# Patient Record
Sex: Female | Born: 1965 | Race: White | Hispanic: No | Smoking: Never smoker
Health system: Southern US, Community
[De-identification: ages and names within clinical notes are randomized; demographics above are authoritative.]

## PROBLEM LIST (undated history)

## (undated) DIAGNOSIS — E039 Hypothyroidism, unspecified: Secondary | ICD-10-CM

## (undated) DIAGNOSIS — I10 Essential (primary) hypertension: Secondary | ICD-10-CM

## (undated) DIAGNOSIS — E119 Type 2 diabetes mellitus without complications: Secondary | ICD-10-CM

## (undated) HISTORY — PX: PELVIC LAPAROSCOPY: SHX162

## (undated) HISTORY — PX: TONSILLECTOMY AND ADENOIDECTOMY: SHX28

## (undated) HISTORY — DX: Type 2 diabetes mellitus without complications: E11.9

## (undated) HISTORY — PX: WISDOM TOOTH EXTRACTION: SHX21

## (undated) HISTORY — DX: Hypothyroidism, unspecified: E03.9

## (undated) HISTORY — DX: Essential (primary) hypertension: I10

---

## 2000-01-01 ENCOUNTER — Other Ambulatory Visit: Admission: RE | Admit: 2000-01-01 | Discharge: 2000-01-01 | Payer: Self-pay | Admitting: Gynecology

## 2001-01-07 ENCOUNTER — Other Ambulatory Visit: Admission: RE | Admit: 2001-01-07 | Discharge: 2001-01-07 | Payer: Self-pay | Admitting: Gynecology

## 2002-01-19 ENCOUNTER — Other Ambulatory Visit: Admission: RE | Admit: 2002-01-19 | Discharge: 2002-01-19 | Payer: Self-pay | Admitting: Gynecology

## 2002-09-01 ENCOUNTER — Ambulatory Visit (HOSPITAL_COMMUNITY): Admission: RE | Admit: 2002-09-01 | Discharge: 2002-09-01 | Payer: Self-pay | Admitting: Cardiology

## 2002-09-01 ENCOUNTER — Encounter (INDEPENDENT_AMBULATORY_CARE_PROVIDER_SITE_OTHER): Payer: Self-pay | Admitting: Cardiology

## 2003-07-22 ENCOUNTER — Other Ambulatory Visit: Admission: RE | Admit: 2003-07-22 | Discharge: 2003-07-22 | Payer: Self-pay | Admitting: Gynecology

## 2004-08-01 ENCOUNTER — Other Ambulatory Visit: Admission: RE | Admit: 2004-08-01 | Discharge: 2004-08-01 | Payer: Self-pay | Admitting: Gynecology

## 2005-10-16 ENCOUNTER — Other Ambulatory Visit: Admission: RE | Admit: 2005-10-16 | Discharge: 2005-10-16 | Payer: Self-pay | Admitting: Gynecology

## 2007-01-07 ENCOUNTER — Other Ambulatory Visit: Admission: RE | Admit: 2007-01-07 | Discharge: 2007-01-07 | Payer: Self-pay | Admitting: Gynecology

## 2008-01-26 ENCOUNTER — Other Ambulatory Visit: Admission: RE | Admit: 2008-01-26 | Discharge: 2008-01-26 | Payer: Self-pay | Admitting: Gynecology

## 2009-02-15 ENCOUNTER — Other Ambulatory Visit: Admission: RE | Admit: 2009-02-15 | Discharge: 2009-02-15 | Payer: Self-pay | Admitting: Gynecology

## 2009-02-15 ENCOUNTER — Ambulatory Visit: Payer: Self-pay | Admitting: Gynecology

## 2009-02-15 ENCOUNTER — Encounter: Payer: Self-pay | Admitting: Gynecology

## 2010-03-19 ENCOUNTER — Ambulatory Visit: Payer: Self-pay | Admitting: Gynecology

## 2010-03-26 ENCOUNTER — Ambulatory Visit: Payer: Self-pay | Admitting: Gynecology

## 2010-07-17 ENCOUNTER — Encounter: Admission: RE | Admit: 2010-07-17 | Discharge: 2010-07-17 | Payer: Self-pay | Admitting: Gynecology

## 2011-05-10 ENCOUNTER — Other Ambulatory Visit: Payer: Self-pay | Admitting: Gynecology

## 2011-06-04 ENCOUNTER — Other Ambulatory Visit: Payer: Self-pay | Admitting: Gynecology

## 2011-06-04 ENCOUNTER — Other Ambulatory Visit: Payer: Self-pay | Admitting: *Deleted

## 2011-06-04 MED ORDER — NORETHINDRONE ACET-ETHINYL EST 1-20 MG-MCG PO TABS: 1.0000 | ORAL_TABLET | Freq: Every day | ORAL | Status: DC

## 2011-07-19 DIAGNOSIS — E039 Hypothyroidism, unspecified: Secondary | ICD-10-CM | POA: Insufficient documentation

## 2011-07-25 ENCOUNTER — Encounter: Payer: Self-pay | Admitting: Gynecology

## 2012-01-23 ENCOUNTER — Encounter: Payer: Self-pay | Admitting: Women's Health

## 2012-01-23 ENCOUNTER — Ambulatory Visit (INDEPENDENT_AMBULATORY_CARE_PROVIDER_SITE_OTHER): Payer: BC Managed Care – PPO | Admitting: Women's Health

## 2012-01-23 ENCOUNTER — Other Ambulatory Visit (HOSPITAL_COMMUNITY)
Admission: RE | Admit: 2012-01-23 | Discharge: 2012-01-23 | Disposition: A | Discharge status: Home / Self Care | Payer: BC Managed Care – PPO | Source: Ambulatory Visit | Attending: Obstetrics and Gynecology | Admitting: Obstetrics and Gynecology

## 2012-01-23 VITALS — BP 130/90 | Ht 66.0 in | Wt 127.0 lb

## 2012-01-23 DIAGNOSIS — Z309 Encounter for contraceptive management, unspecified: Secondary | ICD-10-CM

## 2012-01-23 DIAGNOSIS — IMO0001 Reserved for inherently not codable concepts without codable children: Secondary | ICD-10-CM

## 2012-01-23 DIAGNOSIS — Z833 Family history of diabetes mellitus: Secondary | ICD-10-CM

## 2012-01-23 DIAGNOSIS — E039 Hypothyroidism, unspecified: Secondary | ICD-10-CM

## 2012-01-23 DIAGNOSIS — Z01419 Encounter for gynecological examination (general) (routine) without abnormal findings: Secondary | ICD-10-CM | POA: Insufficient documentation

## 2012-01-23 DIAGNOSIS — E079 Disorder of thyroid, unspecified: Secondary | ICD-10-CM

## 2012-01-23 LAB — CBC WITH DIFFERENTIAL/PLATELET
Basophils Absolute: 0 10*3/uL (ref 0.0–0.1)
Eosinophils Absolute: 0.1 10*3/uL (ref 0.0–0.7)
Eosinophils Relative: 2 % (ref 0–5)
Lymphocytes Relative: 25 % (ref 12–46)
MCV: 93.4 fL (ref 78.0–100.0)
Neutrophils Relative %: 67 % (ref 43–77)
Platelets: 243 10*3/uL (ref 150–400)
RBC: 5.01 MIL/uL (ref 3.87–5.11)
RDW: 12.5 % (ref 11.5–15.5)
WBC: 5.9 10*3/uL (ref 4.0–10.5)

## 2012-01-23 MED ORDER — NORETHINDRONE ACET-ETHINYL EST 1-20 MG-MCG PO TABS: ORAL_TABLET | ORAL | Status: DC

## 2012-01-23 NOTE — Progress Notes (Signed)
Janice Richardson [birth year] [id number]    History:    The patient presents for annual exam.  Takes Microgestin continuously with amenorrhea. History of hypothyroidism on no thyroid replacement at this time. History of normal Paps, last Pap 2010.  Last mammogram 2011 normal. Process of a divorce, has lost approximately 25 pounds in 2 years from stress. Denies need for STD screening.   Past medical history, past surgical history, family history and social history were all reviewed and documented in the EPIC chart. Works at [employer] as a Associate Professor. Sons ages 38 and 61, 62 year old is living with her. States feel safe, does have a restraining order against husband.   ROS:  A  ROS was performed and pertinent positives and negatives are included in the history.  Exam:  Filed Vitals:   01/23/12 0916  BP: 130/90    General appearance:  Normal Head/Neck:  Normal, without cervical or supraclavicular adenopathy. Thyroid:  Symmetrical, normal in size, without palpable masses or nodularity. Respiratory  Effort:  Normal  Auscultation:  Clear without wheezing or rhonchi Cardiovascular  Auscultation:  Regular rate, without rubs, murmurs or gallops  Edema/varicosities:  Not grossly evident Abdominal  Soft,nontender, without masses, guarding or rebound.  Liver/spleen:  No organomegaly noted  Hernia:  None appreciated  Skin  Inspection:  Grossly normal  Palpation:  Grossly normal Neurologic/psychiatric  Orientation:  Normal with appropriate conversation.  Mood/affect:  Normal  Genitourinary    Breasts: Examined lying and sitting.     Right: Without masses, retractions, discharge or axillary adenopathy.     Left: Without masses, retractions, discharge or axillary adenopathy.   Inguinal/mons:  Normal without inguinal adenopathy  External genitalia:  Normal  BUS/Urethra/Skene's glands:  Normal  Bladder:  Normal  Vagina:  Normal  Cervix:  Normal  Uterus:   normal in size, shape and contour.   Midline and mobile  Adnexa/parametria:     Rt: Without masses or tenderness.   Lt: Without masses or tenderness.  Anus and perineum: Normal  Digital rectal exam: Normal sphincter tone without palpated masses or tenderness  Assessment/Plan:  46 y.o. SeperatedWF G2P2 for annual exam with no complaints.  Counseling encouraged for impending divorce Microgestin continuously/normal GYN exam History of hypothyroidism/no medications currently.  Plan: Blood pressure 130/90, the patient states blood pressures never elevated in the past, will check away from the office. Reviewed stopping birth control pills if pressure continueto be elevated. Is aware of the hazards of hypertension and health. Microgestin prescription, proper use, given and reviewed. SBE's, annual mammogram instructed to schedule. Encouraged regular exercise, leisure activities counseling as needed for impending divorce. Had a normal blood pressure, glucose, lipid profile at a health screening at work last month. CBC, thyroid panel, UA and Pap. Reviewed importance of condoms if becomes sexually active.    Harrington Challenger [facility], 11:25 AM 01/23/2012

## 2012-01-23 NOTE — Patient Instructions (Signed)

## 2012-01-24 LAB — URINALYSIS W MICROSCOPIC + REFLEX CULTURE
Hgb urine dipstick: NEGATIVE
Ketones, ur: NEGATIVE mg/dL
Nitrite: NEGATIVE
Urobilinogen, UA: 0.2 mg/dL (ref 0.0–1.0)

## 2012-01-24 LAB — T3 UPTAKE: T3 Uptake: 22.5 % (ref 22.5–37.0)

## 2012-01-24 LAB — T4: T4, Total: 14.1 ug/dL — ABNORMAL HIGH (ref 5.0–12.5)

## 2012-01-27 ENCOUNTER — Other Ambulatory Visit: Payer: Self-pay | Admitting: Women's Health

## 2012-01-27 DIAGNOSIS — N390 Urinary tract infection, site not specified: Secondary | ICD-10-CM

## 2012-01-27 LAB — URINE CULTURE: Colony Count: >=100000

## 2012-01-27 MED ORDER — NITROFURANTOIN MONOHYD MACRO 100 MG PO CAPS: 100.0000 mg | ORAL_CAPSULE | Freq: Two times a day (BID) | ORAL | Status: AC

## 2013-03-12 ENCOUNTER — Other Ambulatory Visit: Payer: Self-pay | Admitting: Women's Health

## 2014-08-01 ENCOUNTER — Encounter: Payer: Self-pay | Admitting: Women's Health

## 2015-03-14 ENCOUNTER — Other Ambulatory Visit: Payer: Self-pay

## 2015-03-14 DIAGNOSIS — Z1231 Encounter for screening mammogram for malignant neoplasm of breast: Secondary | ICD-10-CM

## 2015-03-20 ENCOUNTER — Ambulatory Visit
Admission: RE | Admit: 2015-03-20 | Discharge: 2015-03-20 | Disposition: A | Discharge status: Home / Self Care | Payer: 59 | Source: Ambulatory Visit

## 2015-03-20 DIAGNOSIS — Z1231 Encounter for screening mammogram for malignant neoplasm of breast: Secondary | ICD-10-CM

## 2016-08-08 ENCOUNTER — Encounter: Payer: Self-pay | Admitting: Women's Health

## 2016-08-08 ENCOUNTER — Telehealth: Payer: Self-pay | Admitting: *Deleted

## 2016-08-08 ENCOUNTER — Ambulatory Visit (INDEPENDENT_AMBULATORY_CARE_PROVIDER_SITE_OTHER): Payer: BLUE CROSS/BLUE SHIELD | Admitting: Women's Health

## 2016-08-08 VITALS — BP 170/82 | Ht 66.0 in | Wt 127.0 lb

## 2016-08-08 DIAGNOSIS — Z113 Encounter for screening for infections with a predominantly sexual mode of transmission: Secondary | ICD-10-CM | POA: Diagnosis not present

## 2016-08-08 DIAGNOSIS — Z30011 Encounter for initial prescription of contraceptive pills: Secondary | ICD-10-CM | POA: Diagnosis not present

## 2016-08-08 MED ORDER — NORETHIN ACE-ETH ESTRAD-FE 1-20 MG-MCG PO TABS: 1.0000 | ORAL_TABLET | Freq: Every day | ORAL | 1 refills | Status: DC

## 2016-08-08 MED ORDER — NORETHIN ACE-ETH ESTRAD-FE 1-20 MG-MCG PO TABS: 1.0000 | ORAL_TABLET | Freq: Every day | ORAL | 11 refills | Status: DC

## 2016-08-08 NOTE — Progress Notes (Signed)
Presents for evaluation of irregular bleeding X1 day. Very heavy bleeding like a menstrual cycle yesterday with reduction in blood flow today. She denies cramping, discharge, urinary symptoms, abdominal pain. States that she had some spotting a few months ago and her PCP increased her Microgestin from 1/20 to 1.5/30. Takes the pill continuously and has not had a period in over a year. Continuous OCs for many years for migraine prevention. Same partner for 1.5 years, no STD screening. Unsure if partner is faithful. Thyroid function measured by PCP, normal. States under increased stress, difficult relationship with her ex-husband and current partner.   Exam: Appears Well. Speculum exam: Scant amount of blood. No visible polyp on cervix, cervix looks healthy. Bimanual no CMT or adnexal tenderness uterus small. GC/Chlamydia culture taken  Break through bleeding on OCs  STD screening  Plan: Reassurance given minimal bleeding today. [place] out month of Microgestin 1.5/30, stop pills for 4 days, and then restart Microgestin 1/20. GC/Chlamydia culture pending, HIV, hep B, C, RPR. Counseling recommended. Keep physical appointment in December with Dr. Audie BoxFontaine. Instructed to call with worsening bleeding.

## 2016-08-08 NOTE — Telephone Encounter (Signed)
Pt called c/o heavy vaginal bleeding, last seen in 2013 states this office has not been on her formulary with insurance that is the reason she has not been back. Office now is on formulary, pt said yesterday she had extreme heavy bleeding with clots, takes birth control pills on 3rd pack, bleeding not as heavy today, but still bleeding, PCP told her to follow up with our office. Pt very concerned about bleeding, transferred to front desk to schedule. AEX on 09/05/16 with TF

## 2016-08-09 LAB — HEPATITIS C ANTIBODY: HCV AB: NEGATIVE

## 2016-08-09 LAB — HEPATITIS B SURFACE ANTIGEN: Hepatitis B Surface Ag: NEGATIVE

## 2016-08-09 LAB — RPR

## 2016-08-09 LAB — HIV ANTIBODY (ROUTINE TESTING W REFLEX): HIV: NONREACTIVE

## 2016-08-09 LAB — GC/CHLAMYDIA PROBE AMP
CT PROBE, AMP APTIMA: NOT DETECTED
GC PROBE AMP APTIMA: NOT DETECTED

## 2016-09-05 ENCOUNTER — Ambulatory Visit: Payer: Self-pay | Admitting: Gynecology

## 2016-11-06 ENCOUNTER — Ambulatory Visit: Payer: Self-pay | Admitting: Gynecology

## 2016-11-06 DIAGNOSIS — Z0289 Encounter for other administrative examinations: Secondary | ICD-10-CM

## 2016-12-17 ENCOUNTER — Ambulatory Visit (INDEPENDENT_AMBULATORY_CARE_PROVIDER_SITE_OTHER): Payer: BLUE CROSS/BLUE SHIELD | Admitting: Women's Health

## 2016-12-17 ENCOUNTER — Encounter: Payer: Self-pay | Admitting: Women's Health

## 2016-12-17 ENCOUNTER — Telehealth: Payer: Self-pay

## 2016-12-17 VITALS — BP 140/90 | Ht 66.0 in | Wt 154.6 lb

## 2016-12-17 DIAGNOSIS — R5383 Other fatigue: Secondary | ICD-10-CM | POA: Diagnosis not present

## 2016-12-17 DIAGNOSIS — Z1151 Encounter for screening for human papillomavirus (HPV): Secondary | ICD-10-CM

## 2016-12-17 DIAGNOSIS — Z3041 Encounter for surveillance of contraceptive pills: Secondary | ICD-10-CM

## 2016-12-17 DIAGNOSIS — Z01419 Encounter for gynecological examination (general) (routine) without abnormal findings: Secondary | ICD-10-CM

## 2016-12-17 LAB — TSH: TSH: 3.91 m[IU]/L

## 2016-12-17 MED ORDER — NORETHIN-ETH ESTRAD-FE BIPHAS 1 MG-10 MCG / 10 MCG PO TABS: 1.0000 | ORAL_TABLET | Freq: Every day | ORAL | 4 refills | Status: DC

## 2016-12-17 NOTE — Telephone Encounter (Signed)
Amanda at the pharmacy called to confirm that you changed the patient's birth control pill Rx today. She previously was on Loestrin FE 1/20 and today they received Rx for her for LoLoestrin 1-10.   Please advise.

## 2016-12-17 NOTE — Progress Notes (Signed)
Hyman Hopesina B Kamen November 23, 1965 [id number]    History:    Presents for annual exam with complaining of loosing hair, gain weight and cold extremities.   Monthly cycle,  on Loestrin, for  prevented migraine.  History of hypothyroidism on no medication at this time. History of normal Pap smear. Negative STD screening/ same partner. Gain 25 pounds in the last 2 years.  Reports normal blood work at primary care less than 6 months ago.  Past medical history, past surgical history, family history and social history were all reviewed and documented in the EPIC chart. 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 Has 2 growing sons. Father diabetes, hypercholesterolemia.  ROS:  A ROS was performed and pertinent positives and negatives are included.  Exam:  Vitals:   12/17/16 1516 12/17/16 1549  BP: 140/90 140/90  Weight: 154 lb 9.6 oz (70.1 kg)   Height: 5\' 6"  (1.676 m)    Body mass index is 24.95 kg/m.   General appearance:  Normal Thyroid:  Symmetrical, normal in size, without palpable masses or nodularity. Respiratory  Auscultation:  Clear without wheezing or rhonchi Cardiovascular  Auscultation:  Regular rate, without rubs, murmurs or gallops  Edema/varicosities:  Not grossly evident Abdominal  Soft,nontender, without masses, guarding or rebound.  Liver/spleen:  No organomegaly noted  Hernia:  None appreciated  Skin  Inspection:  Grossly normal   Breasts: Examined lying and sitting.     Right: Without masses, retractions, discharge or axillary adenopathy.     Left: Without masses, retractions, discharge or axillary adenopathy. Gentitourinary   Inguinal/mons:  Normal without inguinal adenopathy  External genitalia:  Normal  BUS/Urethra/Skene's glands:  Normal  Vagina:  Normal  Cervix:  Normal  Uterus:  , normal in size, shape and contour.  Midline and mobile  Adnexa/parametria:     Rt: Without masses or tenderness.   Lt: Without masses or  tenderness.  Anus and perineum: Normal  Digital rectal exam: Normal sphincter tone without palpated masses or tenderness  Assessment/Plan:  51 y.o. DWF @P2  for annual exam with complaint of loosing hair , gain wight, cold extremities.  Elevated blood pressure today History of hypothyroidism Monthly cycle on OCs  Plan: Reviewed blood pressure 140/90 twice will try lo Loestrin after finishing current pack. Instructed to check blood pressure away from office if persists over 130/80 instructed to call.  discussed risk of stroke and blood clots with OCs and elevated blood pressure. SBE's, encouraged annual screening mammogram, overdue instructed to schedule. Encouraged regular exercise, vitamin D 2000 daily and biotin. Reviewed screening colonoscopy at age 50, [facility] information given. Encouraged to decrease tanning. TSH, Pap with HR HPV typing, new screening guidelines reviewed.    Harrington ChallengerYOUNG,Neoma Uhrich J V[facility] Trumbull CampusWHNP, 4:13 PM 12/17/2016

## 2016-12-17 NOTE — Patient Instructions (Signed)
Health Maintenance, Female Adopting a healthy lifestyle and getting preventive care can go a long way to promote health and wellness. Talk with your health care provider about what schedule of regular examinations is right for you. This is a good chance for you to check in with your provider about disease prevention and staying healthy. In between checkups, there are plenty of things you can do on your own. Experts have done a lot of research about which lifestyle changes and preventive measures are most likely to keep you healthy. Ask your health care provider for more information. Weight and diet Eat a healthy diet  Be sure to include plenty of vegetables, fruits, low-fat dairy products, and lean protein.  Do not eat a lot of foods high in solid fats, added sugars, or salt.  Get regular exercise. This is one of the most important things you can do for your health.  Most adults should exercise for at least 150 minutes each week. The exercise should increase your heart rate and make you sweat (moderate-intensity exercise).  Most adults should also do strengthening exercises at least twice a week. This is in addition to the moderate-intensity exercise. Maintain a healthy weight  Body mass index (BMI) is a measurement that can be used to identify possible weight problems. It estimates body fat based on height and weight. Your health care provider can help determine your BMI and help you achieve or maintain a healthy weight.  For females 76 years of age and older:  A BMI below 18.5 is considered underweight.  A BMI of 18.5 to 24.9 is normal.  A BMI of 25 to 29.9 is considered overweight.  A BMI of 30 and above is considered obese. Watch levels of cholesterol and blood lipids  You should start having your blood tested for lipids and cholesterol at 50 years of age, then have this test every 5 years.  You may need to have your cholesterol levels checked more often if:  Your lipid or  cholesterol levels are high.  You are older than 50 years of age.  You are at high risk for heart disease. Cancer screening Lung Cancer  Lung cancer screening is recommended for adults 64-42 years old who are at high risk for lung cancer because of a history of smoking.  A yearly low-dose CT scan of the lungs is recommended for people who:  Currently smoke.  Have quit within the past 15 years.  Have at least a 30-pack-year history of smoking. A pack year is smoking an average of one pack of cigarettes a day for 1 year.  Yearly screening should continue until it has been 15 years since you quit.  Yearly screening should stop if you develop a health problem that would prevent you from having lung cancer treatment. Breast Cancer  Practice breast self-awareness. This means understanding how your breasts normally appear and feel.  It also means doing regular breast self-exams. Let your health care provider know about any changes, no matter how small.  If you are in your 20s or 30s, you should have a clinical breast exam (CBE) by a health care provider every 1-3 years as part of a regular health exam.  If you are 34 or older, have a CBE every year. Also consider having a breast X-ray (mammogram) every year.  If you have a family history of breast cancer, talk to your health care provider about genetic screening.  If you are at high risk for breast cancer, talk  to your health care provider about having an MRI and a mammogram every year.  Breast cancer gene (BRCA) assessment is recommended for women who have family members with BRCA-related cancers. BRCA-related cancers include:  Breast.  Ovarian.  Tubal.  Peritoneal cancers.  Results of the assessment will determine the need for genetic counseling and BRCA1 and BRCA2 testing. Cervical Cancer  Your health care provider may recommend that you be screened regularly for cancer of the pelvic organs (ovaries, uterus, and vagina).  This screening involves a pelvic examination, including checking for microscopic changes to the surface of your cervix (Pap test). You may be encouraged to have this screening done every 3 years, beginning at age 24.  For women ages 66-65, health care providers may recommend pelvic exams and Pap testing every 3 years, or they may recommend the Pap and pelvic exam, combined with testing for human papilloma virus (HPV), every 5 years. Some types of HPV increase your risk of cervical cancer. Testing for HPV may also be done on women of any age with unclear Pap test results.  Other health care providers may not recommend any screening for nonpregnant women who are considered low risk for pelvic cancer and who do not have symptoms. Ask your health care provider if a screening pelvic exam is right for you.  If you have had past treatment for cervical cancer or a condition that could lead to cancer, you need Pap tests and screening for cancer for at least 20 years after your treatment. If Pap tests have been discontinued, your risk factors (such as having a new sexual partner) need to be reassessed to determine if screening should resume. Some women have medical problems that increase the chance of getting cervical cancer. In these cases, your health care provider may recommend more frequent screening and Pap tests. Colorectal Cancer  This type of cancer can be detected and often prevented.  Routine colorectal cancer screening usually begins at 50 years of age and continues through 50 years of age.  Your health care provider may recommend screening at an earlier age if you have risk factors for colon cancer.  Your health care provider may also recommend using home test kits to check for hidden blood in the stool.  A small camera at the end of a tube can be used to examine your colon directly (sigmoidoscopy or colonoscopy). This is done to check for the earliest forms of colorectal cancer.  Routine  screening usually begins at age 41.  Direct examination of the colon should be repeated every 5-10 years through 50 years of age. However, you may need to be screened more often if early forms of precancerous polyps or small growths are found. Skin Cancer  Check your skin from head to toe regularly.  Tell your health care provider about any new moles or changes in moles, especially if there is a change in a mole's shape or color.  Also tell your health care provider if you have a mole that is larger than the size of a pencil eraser.  Always use sunscreen. Apply sunscreen liberally and repeatedly throughout the day.  Protect yourself by wearing long sleeves, pants, a wide-brimmed hat, and sunglasses whenever you are outside. Heart disease, diabetes, and high blood pressure  High blood pressure causes heart disease and increases the risk of stroke. High blood pressure is more likely to develop in:  People who have blood pressure in the high end of the normal range (130-139/85-89 mm Hg).  People who are overweight or obese.  People who are African American.  If you are 59-24 years of age, have your blood pressure checked every 3-5 years. If you are 34 years of age or older, have your blood pressure checked every year. You should have your blood pressure measured twice-once when you are at a hospital or clinic, and once when you are not at a hospital or clinic. Record the average of the two measurements. To check your blood pressure when you are not at a hospital or clinic, you can use:  An automated blood pressure machine at a pharmacy.  A home blood pressure monitor.  If you are between 29 years and 60 years old, ask your health care provider if you should take aspirin to prevent strokes.  Have regular diabetes screenings. This involves taking a blood sample to check your fasting blood sugar level.  If you are at a normal weight and have a low risk for diabetes, have this test once  every three years after 50 years of age.  If you are overweight and have a high risk for diabetes, consider being tested at a younger age or more often. Preventing infection Hepatitis B  If you have a higher risk for hepatitis B, you should be screened for this virus. You are considered at high risk for hepatitis B if:  You were born in a country where hepatitis B is common. Ask your health care provider which countries are considered high risk.  Your parents were born in a high-risk country, and you have not been immunized against hepatitis B (hepatitis B vaccine).  You have HIV or AIDS.  You use needles to inject street drugs.  You live with someone who has hepatitis B.  You have had sex with someone who has hepatitis B.  You get hemodialysis treatment.  You take certain medicines for conditions, including cancer, organ transplantation, and autoimmune conditions. Hepatitis C  Blood testing is recommended for:  Everyone born from [birth year] through 1964.  Anyone with known risk factors for hepatitis C. Sexually transmitted infections (STIs)  You should be screened for sexually transmitted infections (STIs) including gonorrhea and chlamydia if:  You are sexually active and are younger than 50 years of age.  You are older than 50 years of age and your health care provider tells you that you are at risk for this type of infection.  Your sexual activity has changed since you were last screened and you are at an increased risk for chlamydia or gonorrhea. Ask your health care provider if you are at risk.  If you do not have HIV, but are at risk, it may be recommended that you take a prescription medicine daily to prevent HIV infection. This is called pre-exposure prophylaxis (PrEP). You are considered at risk if:  You are sexually active and do not regularly use condoms or know the HIV status of your partner(s).  You take drugs by injection.  You are sexually active with a partner  who has HIV. Talk with your health care provider about whether you are at high risk of being infected with HIV. If you choose to begin PrEP, you should first be tested for HIV. You should then be tested every 3 months for as long as you are taking PrEP. Pregnancy  If you are premenopausal and you may become pregnant, ask your health care provider about preconception counseling.  If you may become pregnant, take 400 to 800 micrograms (mcg) of folic acid  every day.  If you want to prevent pregnancy, talk to your health care provider about birth control (contraception). Osteoporosis and menopause  Osteoporosis is a disease in which the bones lose minerals and strength with aging. This can result in serious bone fractures. Your risk for osteoporosis can be identified using a bone density scan.  If you are 4 years of age or older, or if you are at risk for osteoporosis and fractures, ask your health care provider if you should be screened.  Ask your health care provider whether you should take a calcium or vitamin D supplement to lower your risk for osteoporosis.  Menopause may have certain physical symptoms and risks.  Hormone replacement therapy may reduce some of these symptoms and risks. Talk to your health care provider about whether hormone replacement therapy is right for you. Follow these instructions at home:  Schedule regular health, dental, and eye exams.  Stay current with your immunizations.  Do not use any tobacco products including cigarettes, chewing tobacco, or electronic cigarettes.  If you are pregnant, do not drink alcohol.  If you are breastfeeding, limit how much and how often you drink alcohol.  Limit alcohol intake to no more than 1 drink per day for nonpregnant women. One drink equals 12 ounces of beer, 5 ounces of wine, or 1 ounces of hard liquor.  Do not use street drugs.  Do not share needles.  Ask your health care provider for help if you need support  or information about quitting drugs.  Tell your health care provider if you often feel depressed.  Tell your health care provider if you have ever been abused or do not feel safe at home. This information is not intended to replace advice given to you by your health care provider. Make sure you discuss any questions you have with your health care provider. Document Released: 04/01/2011 Document Revised: 02/22/2016 Document Reviewed: 06/20/2015 Elsevier Interactive Patient Education  2017 [employer].

## 2016-12-18 ENCOUNTER — Telehealth: Payer: Self-pay

## 2016-12-18 ENCOUNTER — Encounter: Payer: Self-pay | Admitting: Women's Health

## 2016-12-18 MED ORDER — NORETHINDRONE 0.35 MG PO TABS: ORAL_TABLET | ORAL | 4 refills | Status: DC

## 2016-12-18 NOTE — Telephone Encounter (Signed)
She is having blood pressure increase, but try Micronor 1 daily, no placebo week take daily may have some irregular bleeding initially. 3 packs 4 refills.

## 2016-12-18 NOTE — Telephone Encounter (Signed)
Patient received new Rx yesterday for Lo-Loestrin 1-10.  Patient said she cannot afford it not even with a coupon they offer. She paid nothing for her previous Rx.  The pharmacy asked if there is another Rx you might want to try her on that might be more affordable for her?

## 2016-12-18 NOTE — Telephone Encounter (Signed)
New Rx sent.

## 2016-12-18 NOTE — Telephone Encounter (Signed)
Per office note "Reviewed blood pressure 140/90 twice will try lo Loestrin after finishing current pack" I advised pharmacy that change in Rx was correct.

## 2016-12-19 LAB — HPV TYPE 16 AND 18/45 RNA
HPV TYPE 16 RNA: NOT DETECTED
HPV TYPE 18/45 RNA: DETECTED — AB

## 2016-12-20 LAB — PAP, TP IMAGING W/ HPV RNA, RFLX HPV TYPE 16,18/45: HPV MRNA, HIGH RISK: DETECTED — AB

## 2017-01-30 ENCOUNTER — Telehealth: Payer: Self-pay

## 2017-01-30 NOTE — Telephone Encounter (Signed)
I called patient and reminded her that we spoke on 12/23/16 about her needing colpo and she was going to call back to schedule and she has not. She said things have been busy. I offered to have the appt desk call her back but she declined. I explained the importance of her calling back to schedule and she assures me she will.

## 2017-02-13 ENCOUNTER — Other Ambulatory Visit: Payer: Self-pay | Admitting: Women's Health

## 2017-02-13 DIAGNOSIS — Z30011 Encounter for initial prescription of contraceptive pills: Secondary | ICD-10-CM

## 2017-10-15 DIAGNOSIS — E1165 Type 2 diabetes mellitus with hyperglycemia: Secondary | ICD-10-CM | POA: Insufficient documentation

## 2017-11-03 ENCOUNTER — Other Ambulatory Visit: Payer: Self-pay | Admitting: Gynecology

## 2017-11-03 DIAGNOSIS — Z1231 Encounter for screening mammogram for malignant neoplasm of breast: Secondary | ICD-10-CM

## 2017-11-06 ENCOUNTER — Other Ambulatory Visit: Payer: Self-pay | Admitting: Women's Health

## 2017-11-06 ENCOUNTER — Encounter: Payer: Self-pay | Admitting: Women's Health

## 2017-11-06 DIAGNOSIS — Z30011 Encounter for initial prescription of contraceptive pills: Secondary | ICD-10-CM

## 2017-11-07 ENCOUNTER — Other Ambulatory Visit: Payer: Self-pay | Admitting: Women's Health

## 2017-11-07 DIAGNOSIS — Z30011 Encounter for initial prescription of contraceptive pills: Secondary | ICD-10-CM

## 2017-11-24 ENCOUNTER — Ambulatory Visit
Admission: RE | Admit: 2017-11-24 | Discharge: 2017-11-24 | Disposition: A | Discharge status: Home / Self Care | Payer: Self-pay | Source: Ambulatory Visit | Attending: Gynecology | Admitting: Gynecology

## 2017-11-24 DIAGNOSIS — Z1231 Encounter for screening mammogram for malignant neoplasm of breast: Secondary | ICD-10-CM

## 2017-11-25 ENCOUNTER — Other Ambulatory Visit: Payer: Self-pay | Admitting: Gynecology

## 2017-11-25 DIAGNOSIS — R928 Other abnormal and inconclusive findings on diagnostic imaging of breast: Secondary | ICD-10-CM

## 2017-11-28 ENCOUNTER — Ambulatory Visit
Admission: RE | Admit: 2017-11-28 | Discharge: 2017-11-28 | Disposition: A | Discharge status: Home / Self Care | Payer: BLUE CROSS/BLUE SHIELD | Source: Ambulatory Visit | Attending: Gynecology | Admitting: Gynecology

## 2017-11-28 ENCOUNTER — Other Ambulatory Visit: Payer: Self-pay | Admitting: Gynecology

## 2017-11-28 ENCOUNTER — Ambulatory Visit: Payer: BLUE CROSS/BLUE SHIELD

## 2017-11-28 DIAGNOSIS — N6489 Other specified disorders of breast: Secondary | ICD-10-CM

## 2017-11-28 DIAGNOSIS — R928 Other abnormal and inconclusive findings on diagnostic imaging of breast: Secondary | ICD-10-CM

## 2018-06-03 ENCOUNTER — Ambulatory Visit
Admission: RE | Admit: 2018-06-03 | Discharge: 2018-06-03 | Disposition: A | Discharge status: Home / Self Care | Payer: BLUE CROSS/BLUE SHIELD | Source: Ambulatory Visit | Attending: Gynecology | Admitting: Gynecology

## 2018-06-03 DIAGNOSIS — N6489 Other specified disorders of breast: Secondary | ICD-10-CM

## 2018-11-11 ENCOUNTER — Other Ambulatory Visit: Payer: Self-pay | Admitting: Family Medicine

## 2018-11-11 DIAGNOSIS — Z1231 Encounter for screening mammogram for malignant neoplasm of breast: Secondary | ICD-10-CM

## 2018-12-07 ENCOUNTER — Ambulatory Visit: Payer: BLUE CROSS/BLUE SHIELD

## 2019-01-01 ENCOUNTER — Ambulatory Visit: Payer: BLUE CROSS/BLUE SHIELD

## 2019-03-12 ENCOUNTER — Encounter: Payer: Self-pay | Admitting: Family Medicine

## 2019-03-23 ENCOUNTER — Other Ambulatory Visit: Payer: Self-pay

## 2019-03-24 ENCOUNTER — Other Ambulatory Visit: Payer: Self-pay

## 2019-03-24 ENCOUNTER — Ambulatory Visit (INDEPENDENT_AMBULATORY_CARE_PROVIDER_SITE_OTHER): Payer: BC Managed Care – PPO | Admitting: Endocrinology

## 2019-03-24 ENCOUNTER — Encounter: Payer: Self-pay | Admitting: Endocrinology

## 2019-03-24 VITALS — BP 142/82 | HR 105 | Temp 98.1°F | Ht 65.0 in | Wt 155.0 lb

## 2019-03-24 DIAGNOSIS — R5383 Other fatigue: Secondary | ICD-10-CM | POA: Diagnosis not present

## 2019-03-24 DIAGNOSIS — E1165 Type 2 diabetes mellitus with hyperglycemia: Secondary | ICD-10-CM | POA: Diagnosis not present

## 2019-03-24 MED ORDER — GLUCOSE BLOOD VI STRP: ORAL_STRIP | 12 refills | Status: DC

## 2019-03-24 MED ORDER — DULOXETINE HCL 30 MG PO CPEP: 30.0000 mg | ORAL_CAPSULE | Freq: Every day | ORAL | 3 refills | Status: AC

## 2019-03-24 MED ORDER — CANAGLIFLOZIN 100 MG PO TABS: ORAL_TABLET | ORAL | 3 refills | Status: DC

## 2019-03-24 MED ORDER — ONETOUCH DELICA LANCETS 33G MISC: 12 refills | Status: DC

## 2019-03-24 NOTE — Progress Notes (Addendum)
Reason for Appointment: Consultation for Type 2 Diabetes  Referring PCP: Janace Litten   History of Present Illness:          Date of diagnosis of type 2 diabetes mellitus: 2017        Background history:   She says her initial diagnosis was on a routine test and she was started on metformin for high blood sugars She has been followed by PCP and has been on a variety of medications previously She had not been started on home glucose monitoring until very recently She had been on metformin for quite some time  Also has had somewhat inconsistent follow-up, A1c results in the past are detailed below Prior A1c results: 11/18/2018 = 9.3 07/30/2018 = 9.6 10/15/2017 = 9.4   Recent history:   Most recent A1c is 8.9 done on 01/26/2019   Non-insulin hypoglycemic drugs the patient is taking are: Metformin 1 g twice daily  Current management, blood sugar patterns and problems identified:  Patient was given a trial of Trulicity probably for about 3 months and this was stopped in 12/2018 because of lack of efficacy and A1c of 8.9  She was then started on Basaglar and Januvia but with this she started gaining weight and felt lethargic and sleepy and she does not think her sugars were better even with taking 25 units which she did for about a month until 3 weeks ago but also she experienced discomfort at the site of injection  Patient does not have any specific meal plan but is usually trying to avoid high fat foods and high carbohydrate meals  Because of fatigue and her feet hurting she does not do any walking for exercise  She is still has not lost any weight recently and overall she appears to have gained 4 pounds since PCP visit last month  She is checking blood sugars from a over-the-counter GE meter which cannot be downloaded  Lowest blood sugar recently has been 189 and highest over 300  She says that her blood sugars are high even when she is eating less        Side  effects from medications have been:      Meal times are:  Breakfast is at Lunch: Dinner:    Typical meal intake: Breakfast is                Exercise: none  Glucose monitoring:  done 1-2  times a day         Glucometer:  GE     Blood Glucose readings by time of day from patient record  PREMEAL Breakfast Lunch Dinner Bedtime  Overall   Glucose range: 189, 242      Median:        POST-MEAL PC Breakfast PC Lunch PC Dinner  Glucose range:   326  Median:       Dietician visit, most recent: None  Weight history:  Wt Readings from Last 3 Encounters:  03/24/19 155 lb (70.3 kg)  12/17/16 154 lb 9.6 oz (70.1 kg)  08/08/16 127 lb (57.6 kg)    Glycemic control:    No results found for: HGBA1C No results found for: GLUF, MICROALBUR, LDLCALC, CREATININE No results found for: MICRALBCREAT  No results found for: FRUCTOSAMINE  No visits with results within 1 Week(s) from this visit.  Latest known visit with results is:  Office Visit on 12/17/2016  Component Date Value Ref Range Status  . TSH 12/17/2016 3.91  mIU/L Final  Comment:   Reference Range   > or = 20 Years  0.40-4.50   Pregnancy Range First trimester  0.26-2.66 Second trimester 0.55-2.73 Third trimester  0.43-2.91     . HPV mRNA, High Risk 12/17/2016 Detected*  Final   Comment: One or more High/Intermediate HPV types (16,18,31,33,35,39,45,51,52,56,58,59,66,68) was detected.                  ** Normal Reference Range: Not Detected **      HPV High Risk testing performed using the APTIMA HPV mRNA Assay.     Marland Kitchen. Specimen adequacy: 12/17/2016 SEE NOTE   Final   SATISFACTORY.  Endocervical/transformation zone component present.  Marland Kitchen. FINAL DIAGNOSIS: 12/17/2016 SEE NOTE*  Final   Comment: - EPITHELIAL CELL ABNORMALITY:  ATYPICAL SQUAMOUS CELLS OF UNDETERMINED SIGNIFICANCE (ASC-US).   Marland Kitchen. COMMENTS: 12/17/2016 SEE NOTE   Final   Comment: Shift in flora suggestive of bacterial vaginosis. LMP (Last Menstrual Period)  of the patient is not given. This Pap test has been evaluated with computer assisted technology.   . RECOMMENDATIONS: 12/17/2016 SEE NOTE   Final   Comment: FOLLOW-UP is suggested as clinically indicated. For a more comprehensive discussion of these recommendations, please refer to the [email or web address] website.   . Cytotechnologist: 12/17/2016 SEE NOTE   Final   KS, CT(HEW)  . Pathologist: 12/17/2016 SEE NOTE   Final   Comment: Reviewed by S. Dene GentrySerdar Demirci, MD, (Electronic Signature on File) *  The Pap is a screening test for cervical cancer. It is not a  diagnostic test and is subject to false negative and false positive  results. It is most reliable when a satisfactory sample, regularly  obtained, is submitted with relevant clinical findings and history,  and when the Pap result is evaluated along with historic and current  clinical information.   Marland Kitchen. HPV Type 16 RNA 12/17/2016 Not Detected   Final  . HPV Type 18/45 RNA 12/17/2016 Detected*  Final   Comment:                  ** Normal Reference Range: Not Detected **        HPV High Risk testing performed using the APTIMA HPV 16, 18/45      mRNA Assay.     Allergies as of 03/24/2019      Reactions   Penicillins       Medication List       Accurate as of March 24, 2019  9:24 PM. If you have any questions, ask your nurse or doctor.        STOP taking these medications   norethindrone 0.35 MG tablet Commonly known as: MICRONOR Stopped by: Reather LittlerAjay Loyd Marhefka, MD   Norethindrone-Ethinyl Estradiol-Fe Biphas 1 MG-10 MCG / 10 MCG tablet Commonly known as: LO LOESTRIN FE Stopped by: Reather LittlerAjay Nazir Hacker, MD   PRILOSEC PO Stopped by: Reather LittlerAjay Yee Joss, MD   ranitidine 150 MG tablet Commonly known as: ZANTAC Stopped by: Reather LittlerAjay Kristyl Athens, MD     TAKE these medications   ALPRAZolam 0.5 MG tablet Commonly known as: XANAX Take 1 tablet by mouth at bedtime.   Blisovi FE 1/20 1-20 MG-MCG tablet Generic drug: norethindrone-ethinyl estradiol TAKE 1 TABLET BY  MOUTH DAILY.   busPIRone 10 MG tablet Commonly known as: BUSPAR Take 1 tablet by mouth 2 (two) times a day.   canagliflozin 100 MG Tabs tablet Commonly known as: Invokana 1 tablet before breakfast Started by: Reather LittlerAjay Asyia Hornung, MD   DULoxetine 30 MG capsule  Commonly known as: CYMBALTA Take 1 capsule (30 mg total) by mouth daily. Take with dinner Started by: Reather LittlerAjay Rayan Dyal, MD   gabapentin 300 MG capsule Commonly known as: NEURONTIN Take 1 capsule by mouth 3 (three) times daily.   glucose blood test strip Use as instructed Started by: Reather LittlerAjay Delle Andrzejewski, MD   lisinopril 20 MG tablet Commonly known as: ZESTRIL Take 1 tablet by mouth 2 (two) times a day.   metFORMIN 500 MG 24 hr tablet Commonly known as: GLUCOPHAGE-XR Take 1,000 mg by mouth 2 (two) times daily.   OneTouch Delica Lancets 33G Misc Use as directed to check blood sugar BID Started by: Reather LittlerAjay Elyas Villamor, MD   pantoprazole 40 MG tablet Commonly known as: PROTONIX Take 1 tablet by mouth daily.       Allergies:  Allergies  Allergen Reactions  . Penicillins     Past Medical History:  Diagnosis Date  . Diabetes (HCC)   . Hypertension   . Hypothyroid    HISTORY OF LOW THYROID, resolved 2005    Past Surgical History:  Procedure Laterality Date  . PELVIC LAPAROSCOPY    . TONSILLECTOMY AND ADENOIDECTOMY    . WISDOM TOOTH EXTRACTION      Family History  Problem Relation Age of Onset  . Uterine cancer Mother   . Diabetes Neg Hx     Social History:  reports that she has never smoked. She has never used smokeless tobacco. She reports that she does not drink alcohol or use drugs.   Review of Systems  Constitutional: Positive for weight gain.  HENT: Negative for headaches.   Eyes: Negative for blurred vision.  Respiratory: Negative for shortness of breath.   Cardiovascular: Negative for leg swelling.  Gastrointestinal: Negative for diarrhea and abdominal pain.  Endocrine: Positive for fatigue.       She had postpartum  thyroiditis 22 years ago and took thyroid supplement still about 15 years ago  Genitourinary: Negative for frequency.       No history of recent UTI.  Will get yeast infection only when taking antibiotics  Musculoskeletal: Positive for joint pain and muscle aches.       Her feet hurt.  She also has shoulder pain She also complains of significant aching in various muscles in different parts at different times  Neurological: Positive for numbness and tingling.       She has coldness in her feet in the evenings and in the mornings may have burning.  She has persistent pain in her feet and this is worse at night or when she is resting.  She does not think she has had any relief from taking gabapentin 1 in the morning and 2 at bedtime  Psychiatric/Behavioral: Positive for nervousness.       She recently has had some grief from the loss of her mother. She takes Xanax and buspirone, has not been on antidepressants    Lipid history: Lipids done in 09/2017 showed LDL 86, triglycerides 276   No results found for: CHOL, HDL, LDLCALC, LDLDIRECT, TRIG, CHOLHDL         Hypertension: Has been present.  Recently because of higher blood pressure readings he is taking lisinopril 20 mg twice daily  BP Readings from Last 3 Encounters:  03/24/19 (!) 142/82  12/17/16 140/90  08/08/16 (!) 170/82    Most recent eye exam was in 04/2018, Dr. Sherilyn CooterWilliam Walker  Most recent foot exam: 03/2019  Currently known complications of diabetes: Peripheral neuropathy  LABS:  No visits with results within 1 Week(s) from this visit.  Latest known visit with results is:  Office Visit on 12/17/2016  Component Date Value Ref Range Status  . TSH 12/17/2016 3.91  mIU/L Final   Comment:   Reference Range   > or = 20 Years  0.40-4.50   Pregnancy Range First trimester  0.26-2.66 Second trimester 0.55-2.73 Third trimester  0.43-2.91     . HPV mRNA, High Risk 12/17/2016 Detected*  Final   Comment: One or more  High/Intermediate HPV types (16,18,31,33,35,39,45,51,52,56,58,59,66,68) was detected.                  ** Normal Reference Range: Not Detected **      HPV High Risk testing performed using the APTIMA HPV mRNA Assay.     Marland Kitchen Specimen adequacy: 12/17/2016 SEE NOTE   Final   SATISFACTORY.  Endocervical/transformation zone component present.  Marland Kitchen FINAL DIAGNOSIS: 12/17/2016 SEE NOTE*  Final   Comment: - EPITHELIAL CELL ABNORMALITY:  ATYPICAL SQUAMOUS CELLS OF UNDETERMINED SIGNIFICANCE (ASC-US).   Marland Kitchen COMMENTS: 12/17/2016 SEE NOTE   Final   Comment: Shift in flora suggestive of bacterial vaginosis. LMP (Last Menstrual Period) of the patient is not given. This Pap test has been evaluated with computer assisted technology.   . RECOMMENDATIONS: 12/17/2016 SEE NOTE   Final   Comment: FOLLOW-UP is suggested as clinically indicated. For a more comprehensive discussion of these recommendations, please refer to the [email or web address] website.   . Cytotechnologist: 12/17/2016 SEE NOTE   Final   KS, CT(HEW)  . Pathologist: 12/17/2016 SEE NOTE   Final   Comment: Reviewed by S. Dene Gentry, MD, (Electronic Signature on File) *  The Pap is a screening test for cervical cancer. It is not a  diagnostic test and is subject to false negative and false positive  results. It is most reliable when a satisfactory sample, regularly  obtained, is submitted with relevant clinical findings and history,  and when the Pap result is evaluated along with historic and current  clinical information.   Marland Kitchen HPV Type 16 RNA 12/17/2016 Not Detected   Final  . HPV Type 18/45 RNA 12/17/2016 Detected*  Final   Comment:                  ** Normal Reference Range: Not Detected **        HPV High Risk testing performed using the APTIMA HPV 16, 18/45      mRNA Assay.     Physical Examination:  BP (!) 142/82 (BP Location: Right Arm, Patient Position: Sitting, Cuff Size: Normal)   Pulse (!) 105   Temp 98.1 F (36.7 C) (Oral)    Ht  (1.651 m)   Wt 155 lb (70.3 kg)   SpO2 98%   BMI 25.79 kg/m   GENERAL:     HEENT:         Eye exam shows normal external appearance.  Fundus exam deferred to ophthalmologist     NECK:   There is no lymphadenopathy  Thyroid is not enlarged and no nodules felt.   Systemic exam not done to avoid proximity to patient    NEUROLOGICAL:   Ankle jerks are absent bilaterally.    Diabetic Foot Exam - Simple   Simple Foot Form Diabetic Foot exam was performed with the following findings: Yes   Visual Inspection No deformities, no ulcerations, no other skin breakdown bilaterally: Yes Sensation Testing Intact to touch and monofilament testing bilaterally:  Yes See comments: Yes Pulse Check Posterior Tibialis and Dorsalis pulse intact bilaterally: Yes Comments Inconsistent decrease in monofilament sensation in left first and second toes            Vibration sense is absent in distal first toes.  MUSCULOSKELETAL:  There is no swelling or deformity of the peripheral joints.     EXTREMITIES:     There is no ankle edema.  SKIN:       No rash or lesions of concern.        ASSESSMENT:  Diabetes type 2, uncontrolled  See history of present illness for detailed discussion of current diabetes management, blood sugar patterns and problems identified  Recent A1c of 8.9 with taking metformin and Trulicity  Current treatment regimen is metformin only She has apparently had not improved her control with Trulicity, basal insulin, Januvia was concerned about weight gain with insulin Appears to have consistently high readings and not clear if she is doing enough readings after meals to assess her control Also not clear if her generic monitor is accurate  Complications of diabetes: Peripheral neuropathy  Hypertension by PCP  History of hypertriglyceridemia, not recently assessed  History of abnormal liver functions as of 10/2018, not recently assessed, may be related to hepatic  steatosis   PLAN:    1. Glucose monitoring: . Patient advised to check readings either fasting or 2 hours after meals.  She will check with her insurance to see which meter will be covered but in the meantime will start her on One Touch Verio monitor with a sample today.  She will need to bring this for download on each visit  2.  Diabetes education: . Patient may need consultation with dietitian for meal planning, this will be further assessed  3.  Lifestyle changes: . Dietary changes have a protein consistently with every meal . Exercise regimen: Restart walking when able to  4.  Medication changes needed: Discussed action of SGLT 2 drugs on lowering glucose by decreasing kidney absorption of glucose, benefits of weight loss and lower blood pressure, possible side effects including candidiasis and dosage regimen  . Start Invokana 100 mg daily before breakfast in addition to metformin; she will be having renal function checked today to confirm it is normal . Increase fluid intake, watch blood pressure changes . She will reduce her lisinopril to 20 mg daily to avoid lower blood pressure readings or renal dysfunction with adding Invokana  NEUROPATHY treatment: She will be given a trial of Cymbalta 30 mg daily at dinnertime She can continue taking 300 mg gabapentin at bedtime  DEPRESSION and anxiety: She is on BuSpar and will add Cymbalta as above She will also benefit from Cymbalta if she is having muscle aches from FIBROMYALGIA  5.  Preventive care needed:  . Regular foot exams, microalbumin testing  6.  Follow-up: 1 month  7.  Check thyroid levels for prior history of hypothyroidism and current symptoms of fatigue and myalgia Check chemistry including liver functions   There are no Patient Instructions on file for this visit.   Consultation note has been sent to the referring physician  Counseling time on subjects discussed in assessment and plan sections is over 50% of  today's 60 minute visit   Reather LittlerAjay Parnika Tweten 03/24/2019, 9:24 PM   Note: This office note was prepared with Dragon voice recognition system technology. Any transcriptional errors that result from this process are unintentional.

## 2019-03-25 LAB — TSH: TSH: 3.25 u[IU]/mL (ref 0.35–4.50)

## 2019-03-25 LAB — COMPREHENSIVE METABOLIC PANEL
ALT: 66 U/L — ABNORMAL HIGH (ref 0–35)
AST: 82 U/L — ABNORMAL HIGH (ref 0–37)
Albumin: 3.9 g/dL (ref 3.5–5.2)
Alkaline Phosphatase: 70 U/L (ref 39–117)
BUN: 11 mg/dL (ref 6–23)
CO2: 22 mEq/L (ref 19–32)
Calcium: 9.1 mg/dL (ref 8.4–10.5)
Chloride: 95 mEq/L — ABNORMAL LOW (ref 96–112)
Creatinine, Ser: 0.69 mg/dL (ref 0.40–1.20)
GFR: 89.48 mL/min (ref >60.00)
Glucose, Bld: 280 mg/dL — ABNORMAL HIGH (ref 70–99)
Potassium: 4.4 mEq/L (ref 3.5–5.1)
Sodium: 129 mEq/L — ABNORMAL LOW (ref 135–145)
Total Bilirubin: 0.4 mg/dL (ref 0.2–1.2)
Total Protein: 7.2 g/dL (ref 6.0–8.3)

## 2019-03-25 LAB — T4, FREE: Free T4: 1.04 ng/dL (ref 0.60–1.60)

## 2019-04-14 ENCOUNTER — Telehealth: Payer: Self-pay | Admitting: Endocrinology

## 2019-04-14 NOTE — Telephone Encounter (Signed)
She needs to check with her insurance to see what meter they will cover.  We could not download her meter.  Okay to take Diflucan

## 2019-04-14 NOTE — Telephone Encounter (Signed)
Please advise. Pt was informed that her labs were forwarded to her PCP. She was not told that her PCP would be calling her.

## 2019-04-14 NOTE — Telephone Encounter (Signed)
Patients states that the OneTouch meter that was given to her by Dr.Kumar, supplies are to expensive and she would like to continue using her other GE meter from [employer].  Patient also brought up when she spoke with Olen Cordial on 6/26 about her labs and while he stated her PCP would call her and she never heard from them.  Patient also called the after hours service on 04/01/19, "Caller states she has s/s of yeast infection. Has discharge and itching, has tried OTC meds without success. Was started on Invocana last week." They had faxed over orders informing that they had patient take Diflucan 150 mg 1 tablet. Patient wanted to make sure with Dr.Kumar this was ok.  Please Advise, Thanks

## 2019-04-14 NOTE — Telephone Encounter (Signed)
Called pt and gave her MD message. Pt verbalized understanding and stated that she has quit taking Invokana because of the side effects it has been causing.

## 2019-04-19 ENCOUNTER — Other Ambulatory Visit: Payer: Self-pay

## 2019-04-19 ENCOUNTER — Other Ambulatory Visit (INDEPENDENT_AMBULATORY_CARE_PROVIDER_SITE_OTHER): Payer: BC Managed Care – PPO

## 2019-04-19 DIAGNOSIS — E1165 Type 2 diabetes mellitus with hyperglycemia: Secondary | ICD-10-CM

## 2019-04-20 LAB — BASIC METABOLIC PANEL
BUN: 10 mg/dL (ref 6–23)
CO2: 24 mEq/L (ref 19–32)
Calcium: 9.3 mg/dL (ref 8.4–10.5)
Chloride: 97 mEq/L (ref 96–112)
Creatinine, Ser: 0.67 mg/dL (ref 0.40–1.20)
GFR: 92.55 mL/min (ref >60.00)
Glucose, Bld: 156 mg/dL — ABNORMAL HIGH (ref 70–99)
Potassium: 4.1 mEq/L (ref 3.5–5.1)
Sodium: 133 mEq/L — ABNORMAL LOW (ref 135–145)

## 2019-04-20 LAB — FRUCTOSAMINE: Fructosamine: 342 umol/L — ABNORMAL HIGH (ref 0–285)

## 2019-04-21 ENCOUNTER — Ambulatory Visit: Payer: BC Managed Care – PPO | Admitting: Endocrinology

## 2019-04-21 ENCOUNTER — Other Ambulatory Visit: Payer: Self-pay

## 2019-04-21 ENCOUNTER — Encounter: Payer: Self-pay | Admitting: Endocrinology

## 2019-04-21 VITALS — BP 118/80 | HR 102 | Ht 65.0 in | Wt 152.0 lb

## 2019-04-21 DIAGNOSIS — E1165 Type 2 diabetes mellitus with hyperglycemia: Secondary | ICD-10-CM | POA: Diagnosis not present

## 2019-04-21 LAB — POCT GLYCOSYLATED HEMOGLOBIN (HGB A1C): Hemoglobin A1C: 9 % — AB (ref 4.0–5.6)

## 2019-04-21 MED ORDER — ACCU-CHEK GUIDE W/DEVICE KIT: 1.0000 | PACK | Freq: Three times a day (TID) | 0 refills | Status: AC

## 2019-04-21 MED ORDER — OZEMPIC (0.25 OR 0.5 MG/DOSE) 2 MG/1.5ML ~~LOC~~ SOPN: 0.5000 mg | PEN_INJECTOR | SUBCUTANEOUS | 2 refills | Status: DC

## 2019-04-21 MED ORDER — OZEMPIC (0.25 OR 0.5 MG/DOSE) 2 MG/1.5ML ~~LOC~~ SOPN: 0.5000 mg | PEN_INJECTOR | SUBCUTANEOUS | 2 refills | Status: AC

## 2019-04-21 MED ORDER — ACCU-CHEK GUIDE VI STRP: ORAL_STRIP | 3 refills | Status: DC

## 2019-04-21 MED ORDER — ACCU-CHEK FASTCLIX LANCETS MISC: 3 refills | Status: AC

## 2019-04-21 NOTE — Patient Instructions (Signed)
Check blood sugars on waking up 3 days a week  Also check blood sugars about 2 hours after meals and do this after different meals by rotation  Recommended blood sugar levels on waking up are 90-130 and about 2 hours after meal is 130-160  Please bring your blood sugar monitor to each visit, thank you   

## 2019-04-21 NOTE — Progress Notes (Signed)
Reason for Appointment: Follow-up for Type 2 Diabetes  Referring PCP: Keturah Barreobert Robbins   History of Present Illness:          Date of diagnosis of type 2 diabetes mellitus: 2017        Background history:   She says her initial diagnosis was on a routine test and she was started on metformin for high blood sugars She has been followed by PCP and has been on a variety of medications previously She had not been started on home glucose monitoring until very recently She had been on metformin for quite some time  Also has had somewhat inconsistent follow-up, A1c results in the past are detailed below Prior A1c results: 11/18/2018 = 9.3 07/30/2018 = 9.6 10/15/2017 = 9.4  Patient was given a trial of Trulicity probably for about 3 months and this was stopped in 12/2018 because of lack of efficacy and A1c of 8.9   Recent history:   Her A1c is about the same at 9%   Non-insulin hypoglycemic drugs the patient is taking are: Metformin 1 g twice daily  Current management, blood sugar patterns and problems identified:  Patient was given a trial of INVOKANA on her initial consultation about a month ago  She says that after about 10 days she had fairly severe yeast infection and she stopped Invokana  Even though she took it regularly she did not think her blood sugars were any different however she checks blood sugars mostly fasting only  She states that her blood sugars in the mornings are higher if she has a healthier low-fat meal and does not see any correlation  She has generally been trying to eat healthy meals and controlling high carbohydrate intake  She still has not started any regular exercise despite reminders  Her lab glucose was 156 but she did not have a home reading at the same time  She continues to use a generic meter because of cost and unable to figure out what her insurance will cover        Side effects from medications have been: Vaginal candidiasis  from Invokana  Typical meal intake: Usually low-fat       Exercise: none  Glucose monitoring:  done 1-2  times a day         Glucometer:  GE     Blood Glucose readings by time of day from patient meter review   PRE-MEAL Fasting Lunch Dinner Bedtime Overall  Glucose range:  137-345      Mean/median:         POST-MEAL PC Breakfast PC Lunch PC Dinner  Glucose range:    230-362  Mean/median:      Previous readings:  PREMEAL Breakfast Lunch Dinner Bedtime  Overall   Glucose range: 189, 242      Median:        POST-MEAL PC Breakfast PC Lunch PC Dinner  Glucose range:   326  Median:       Dietician visit, most recent: None  Weight history:  Wt Readings from Last 3 Encounters:  04/21/19 152 lb (68.9 kg)  03/24/19 155 lb (70.3 kg)  12/17/16 154 lb 9.6 oz (70.1 kg)    Glycemic control:     Lab Results  Component Value Date   HGBA1C 9.0 (A) 04/21/2019   Lab Results  Component Value Date   CREATININE 0.67 04/19/2019   No results found for: [facility]  Lab Results  Component Value Date   FRUCTOSAMINE  342 (H) 04/19/2019    Office Visit on 04/21/2019  Component Date Value Ref Range Status  . Hemoglobin A1C 04/21/2019 9.0* 4.0 - 5.6 % Final  Lab on 04/19/2019  Component Date Value Ref Range Status  . Sodium 04/19/2019 133* 135 - 145 mEq/L Final  . Potassium 04/19/2019 4.1  3.5 - 5.1 mEq/L Final  . Chloride 04/19/2019 97  96 - 112 mEq/L Final  . CO2 04/19/2019 24  19 - 32 mEq/L Final  . Glucose, Bld 04/19/2019 156* 70 - 99 mg/dL Final  . BUN 16/10/960407/20/2020 10  6 - 23 mg/dL Final  . Creatinine, Ser 04/19/2019 0.67  0.40 - 1.20 mg/dL Final  . Calcium 54/09/811907/20/2020 9.3  8.4 - 10.5 mg/dL Final  . GFR 14/78/295607/20/2020 92.55  >60.00 mL/min Final  . Fructosamine 04/19/2019 342* 0 - 285 umol/L Final   Comment: Published reference interval for apparently healthy subjects between age 52 and 760 is 55205 - 285 umol/L and in a poorly controlled diabetic population is 228 - 563 umol/L  with a mean of 396 umol/L.     Allergies as of 04/21/2019      Reactions   Penicillins       Medication List       Accurate as of April 21, 2019  3:21 PM. If you have any questions, ask your nurse or doctor.        STOP taking these medications   canagliflozin 100 MG Tabs tablet Commonly known as: Invokana Stopped by: Reather LittlerAjay Hisayo Delossantos, MD     TAKE these medications   ALPRAZolam 0.5 MG tablet Commonly known as: XANAX Take 1 tablet by mouth at bedtime.   Blisovi FE 1/20 1-20 MG-MCG tablet Generic drug: norethindrone-ethinyl estradiol TAKE 1 TABLET BY MOUTH DAILY.   busPIRone 10 MG tablet Commonly known as: BUSPAR Take 1 tablet by mouth 2 (two) times a day.   DULoxetine 30 MG capsule Commonly known as: CYMBALTA Take 1 capsule (30 mg total) by mouth daily. Take with dinner   gabapentin 300 MG capsule Commonly known as: NEURONTIN Take 1 capsule by mouth 3 (three) times daily.   glucose blood test strip Use as instructed   lisinopril 20 MG tablet Commonly known as: ZESTRIL Take 1 tablet by mouth 2 (two) times a day.   metFORMIN 500 MG 24 hr tablet Commonly known as: GLUCOPHAGE-XR Take 1,000 mg by mouth 2 (two) times daily.   OneTouch Delica Lancets 33G Misc Use as directed to check blood sugar BID   pantoprazole 40 MG tablet Commonly known as: PROTONIX Take 1 tablet by mouth daily.       Allergies:  Allergies  Allergen Reactions  . Penicillins     Past Medical History:  Diagnosis Date  . Diabetes (HCC)   . Hypertension   . Hypothyroid    HISTORY OF LOW THYROID, resolved 2005    Past Surgical History:  Procedure Laterality Date  . PELVIC LAPAROSCOPY    . TONSILLECTOMY AND ADENOIDECTOMY    . WISDOM TOOTH EXTRACTION      Family History  Problem Relation Age of Onset  . Uterine cancer Mother   . Diabetes Neg Hx     Social History:  reports that she has never smoked. She has never used smokeless tobacco. She reports that she does not drink  alcohol or use drugs.   Review of Systems    Lipid history: Lipids done in 09/2017 showed LDL 86, triglycerides 276   No results found for:  CHOL, HDL, LDLCALC, LDLDIRECT, TRIG, CHOLHDL         Hypertension: Has been present.  She has been taking lisinopril 20 mg twice daily from her PCP  BP Readings from Last 3 Encounters:  04/21/19 118/80  03/24/19 (!) 142/82  12/17/16 140/90    Most recent eye exam was in 04/2018, Dr. Sherilyn Cooter  Most recent foot exam: 03/2019  Currently known complications of diabetes: Peripheral neuropathy She was given Cymbalta on her last visit to try  Abnormal liver function: She was asked to follow-up with her PCP but she has not been able to contact them  Lab Results  Component Value Date   ALT 66 (H) 03/24/2019     LABS:  Office Visit on 04/21/2019  Component Date Value Ref Range Status  . Hemoglobin A1C 04/21/2019 9.0* 4.0 - 5.6 % Final  Lab on 04/19/2019  Component Date Value Ref Range Status  . Sodium 04/19/2019 133* 135 - 145 mEq/L Final  . Potassium 04/19/2019 4.1  3.5 - 5.1 mEq/L Final  . Chloride 04/19/2019 97  96 - 112 mEq/L Final  . CO2 04/19/2019 24  19 - 32 mEq/L Final  . Glucose, Bld 04/19/2019 156* 70 - 99 mg/dL Final  . BUN 16/07/9603 10  6 - 23 mg/dL Final  . Creatinine, Ser 04/19/2019 0.67  0.40 - 1.20 mg/dL Final  . Calcium 54/05/8118 9.3  8.4 - 10.5 mg/dL Final  . GFR 14/78/2956 92.55  >60.00 mL/min Final  . Fructosamine 04/19/2019 342* 0 - 285 umol/L Final   Comment: Published reference interval for apparently healthy subjects between age 52 and 65 is 61 - 285 umol/L and in a poorly controlled diabetic population is 228 - 563 umol/L with a mean of 396 umol/L.     Physical Examination:  BP 118/80 (BP Location: Left Arm, Patient Position: Sitting, Cuff Size: Normal)   Pulse (!) 102   Ht  (1.651 m)   Wt 152 lb (68.9 kg)   SpO2 98%   BMI 25.29 kg/m        ASSESSMENT:  Diabetes type 2, uncontrolled   See history of present illness for detailed discussion of current diabetes management, blood sugar patterns and problems identified  Her A1c is still high at 9% Fructosamine of 342 also indicates poor control  Current treatment regimen is only metformin and she apparently could not tolerate her Invokana prescription  She is not a candidate for basal insulin because of variable fasting readings as low as 137 but also previous lack of adequate control and weight gain Currently not monitoring blood sugars enough especially after meals to establish a pattern and not clear what factors make her blood sugars fluctuate Her diet and correlation with sugar was discussed in detail  Hypertension, controlled   History of abnormal liver functions as of 10/2018, persistent and not clear if it is related to fatty liver, she has been asked to see her PCP for evaluation and follow-up she will need to call again   PLAN:    1. Glucose monitoring: . Patient advised to check readings less often fasting and more 2 hours after meals.  She will check with her insurance to see which meter will be covered but in the meantime will give a prescription for Accu-Chek once One Touch barrio was not covered . Otherwise may need to make sure her BG meter is accurate  2.  Diabetes education: . Patient may need consultation with dietitian for meal planning, currently  appears to have usually improved in diet  3.  Lifestyle changes: . Dietary changes have a protein consistently with every meal . Exercise regimen: Again recommended walking when able to  4.  Medication changes needed: . Trial of the GLP-1 drug again because of variable blood sugars and tendency to mostly postprandial hyperglycemia not controlled with metformin.  She will try the most effective GLP-1 drug which is Ozempic and discussed in detail advantages of this and dosage regimen.  Showed her how the injection will be done and the dosage will be dialed  on the pen.  Discussed possible side effects and given patient information brochure and co-pay card with this . Also may consider adding Prandin before meals consistently high postprandial readings  NEUROPATHY treatment: She will continue Cymbalta 30 mg daily at dinnertime She can continue taking 300 mg gabapentin at bedtime  5.  Preventive care needed:  . Needs microalbumin testing  6.  Follow-up: 1 month  Will recheck liver enzymes on the next visit   Patient Instructions  Check blood sugars on waking up 3 days a week  Also check blood sugars about 2 hours after meals and do this after different meals by rotation  Recommended blood sugar levels on waking up are 90-130 and about 2 hours after meal is 130-160  Please bring your blood sugar monitor to each visit, thank you       Counseling time on subjects discussed in assessment and plan sections is over 50% of today's 25 minute visit   Elayne Snare 04/21/2019, 3:21 PM   Note: This office note was prepared with Dragon voice recognition system technology. Any transcriptional errors that result from this process are unintentional.

## 2019-05-07 ENCOUNTER — Telehealth: Payer: Self-pay | Admitting: Endocrinology

## 2019-05-07 NOTE — Telephone Encounter (Signed)
Okay to print a letter with that information and I will sign.  She will continue 0.25 mg weekly until she is completely adjusted to the dose and then go to 0.5.  Have her blood sugars come down?

## 2019-05-07 NOTE — Telephone Encounter (Signed)
Letter refaxed X3.

## 2019-05-07 NOTE — Telephone Encounter (Signed)
Patient called re: after taking the second dose of Ozempic the medication gave patient diarrhea and patient was vomiting. Patient not experiencing side effects today. Patient had to take off work 05/05/19 & 05/06/19. Patient's work requires patient to get a letter from Dr. Dwyane Dee that states patient was out sick due to side effects of the medication and that patient does not have Covid 19. Patient is not allowed to work until her employer receives the above mentioned letter. Please fax letter asap to Fax# [phone] [facility]).

## 2019-05-07 NOTE — Telephone Encounter (Signed)
Please advise 

## 2019-05-07 NOTE — Telephone Encounter (Signed)
Patient called to inform her work only received our cover sheet, the letter did not come through.  Please Advise, Thanks

## 2019-05-07 NOTE — Telephone Encounter (Signed)
Patient called to inform her work has never received the letter. Please refax asap. Fax number we have is correct.

## 2019-05-07 NOTE — Telephone Encounter (Signed)
Letter faxed.

## 2019-05-07 NOTE — Telephone Encounter (Signed)
Called pt and advised her of MD message. Pt verbalized understanding. Pt stated that he blood sugars right now are around 160 in the morning, and 200-260 before bed.

## 2019-05-19 ENCOUNTER — Ambulatory Visit: Payer: BC Managed Care – PPO | Admitting: Endocrinology

## 2019-06-15 ENCOUNTER — Ambulatory Visit: Payer: BC Managed Care – PPO | Admitting: Endocrinology

## 2019-06-23 ENCOUNTER — Encounter: Payer: Self-pay | Admitting: Gynecology

## 2020-06-12 ENCOUNTER — Other Ambulatory Visit: Payer: Self-pay | Admitting: Endocrinology

## 2020-12-01 ENCOUNTER — Other Ambulatory Visit: Payer: Self-pay | Admitting: Family Medicine

## 2020-12-01 DIAGNOSIS — Z1231 Encounter for screening mammogram for malignant neoplasm of breast: Secondary | ICD-10-CM

## 2021-01-23 ENCOUNTER — Inpatient Hospital Stay: Admission: RE | Admit: 2021-01-23 | Payer: BC Managed Care – PPO | Source: Ambulatory Visit

## 2021-03-13 ENCOUNTER — Ambulatory Visit
Admission: RE | Admit: 2021-03-13 | Discharge: 2021-03-13 | Disposition: A | Discharge status: Home / Self Care | Payer: BC Managed Care – PPO | Source: Ambulatory Visit | Attending: Family Medicine | Admitting: Family Medicine

## 2021-03-13 ENCOUNTER — Ambulatory Visit: Payer: Self-pay

## 2021-03-13 ENCOUNTER — Other Ambulatory Visit: Payer: Self-pay

## 2021-03-13 DIAGNOSIS — Z1231 Encounter for screening mammogram for malignant neoplasm of breast: Secondary | ICD-10-CM

## 2021-03-16 ENCOUNTER — Other Ambulatory Visit: Payer: Self-pay | Admitting: Family Medicine

## 2021-03-16 DIAGNOSIS — R928 Other abnormal and inconclusive findings on diagnostic imaging of breast: Secondary | ICD-10-CM

## 2021-03-21 ENCOUNTER — Other Ambulatory Visit: Payer: BC Managed Care – PPO

## 2021-03-27 ENCOUNTER — Other Ambulatory Visit: Payer: Self-pay

## 2021-03-27 ENCOUNTER — Ambulatory Visit
Admission: RE | Admit: 2021-03-27 | Discharge: 2021-03-27 | Disposition: A | Discharge status: Home / Self Care | Payer: BC Managed Care – PPO | Source: Ambulatory Visit | Attending: Family Medicine | Admitting: Family Medicine

## 2021-03-27 ENCOUNTER — Other Ambulatory Visit: Payer: Self-pay | Admitting: Family Medicine

## 2021-03-27 DIAGNOSIS — R928 Other abnormal and inconclusive findings on diagnostic imaging of breast: Secondary | ICD-10-CM

## 2021-12-26 ENCOUNTER — Other Ambulatory Visit: Payer: Self-pay | Admitting: Endocrinology

## 2023-04-19 IMAGING — MG MM DIGITAL DIAGNOSTIC UNILAT*L* W/ TOMO W/ CAD
6 of 10 series · 6 of 30 positions shown · non-contrast
Comparison: Previous exams including recent screening mammogram
dated 03/13/2021.

CLINICAL DATA: Patient returns today to evaluate a possible LEFT
breast distortion questioned on recent screening mammogram.

EXAM:
DIGITAL DIAGNOSTIC UNILATERAL LEFT MAMMOGRAM WITH TOMOSYNTHESIS AND
CAD; ULTRASOUND LEFT BREAST LIMITED
TECHNIQUE: Left digital diagnostic mammography and breast tomosynthesis was
performed. The images were evaluated with computer-aided detection.;
Targeted ultrasound examination of the left breast was performed

[L CC synth-2D (1 of 2)]
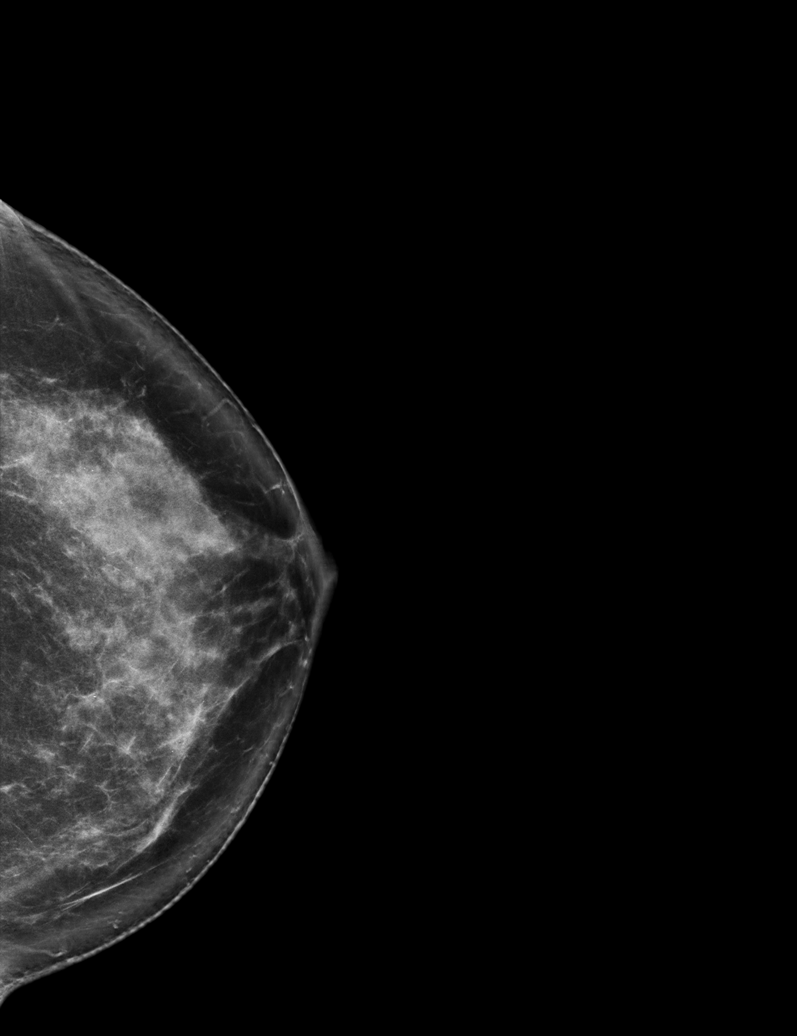

[L MLO synth-2D]
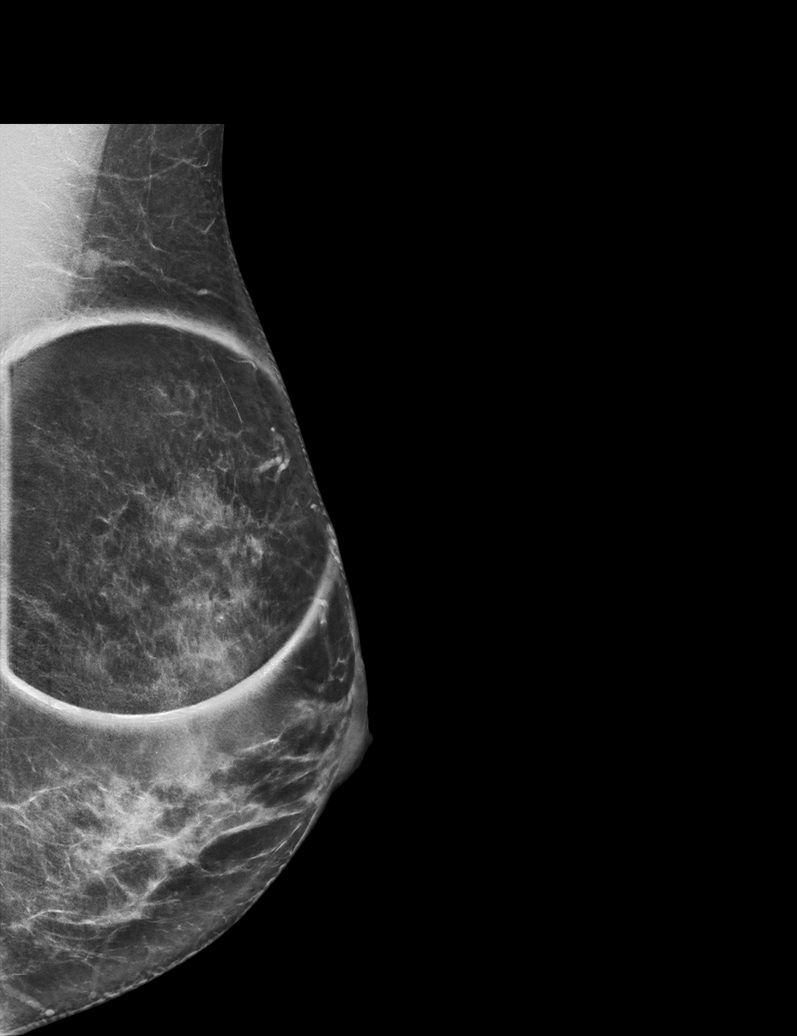

[L CC synth-2D (2 of 2)]
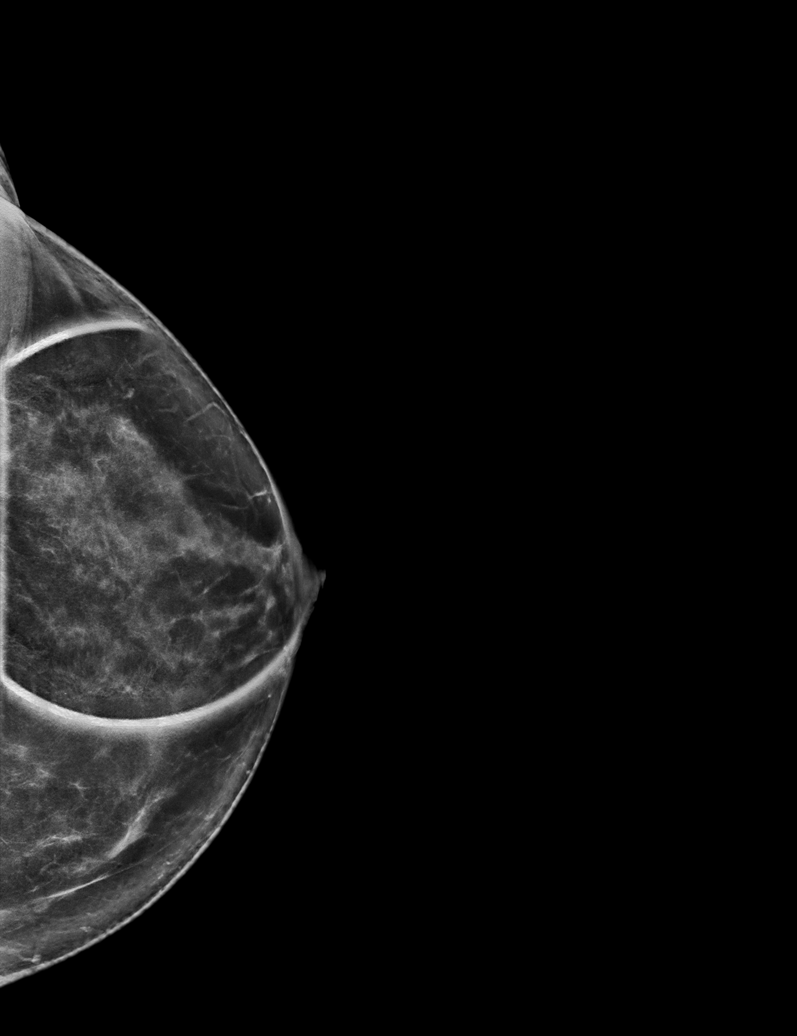

[L ML synth-2D]
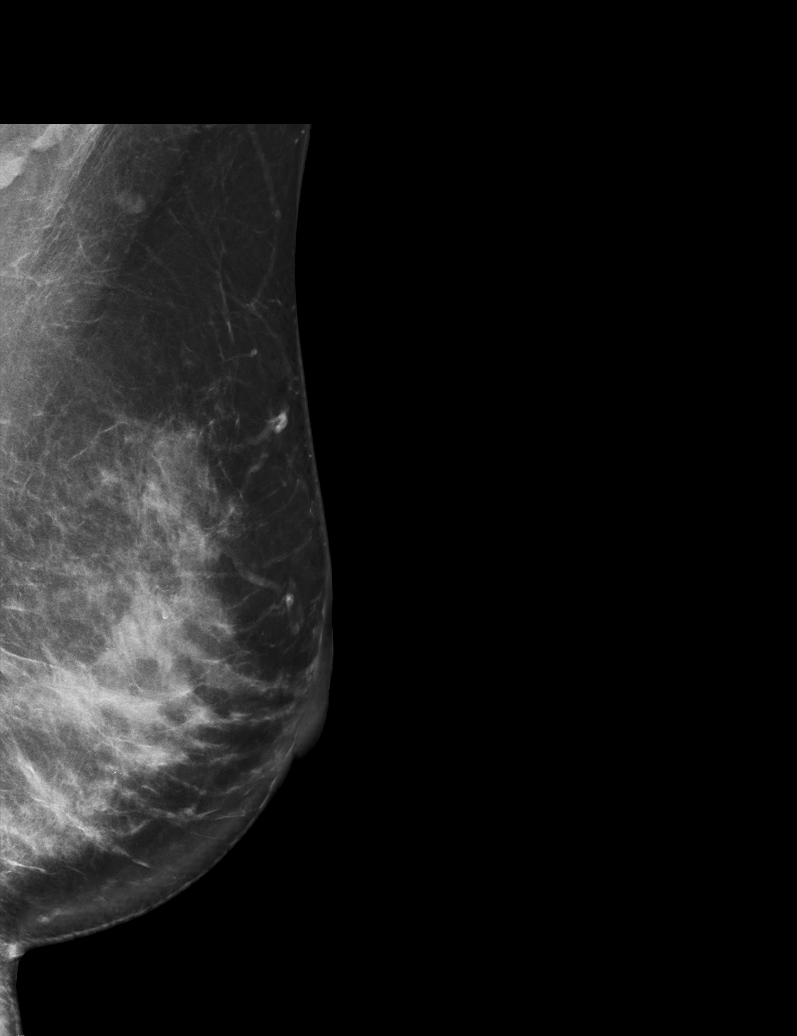

[L XCCL synth-2D]
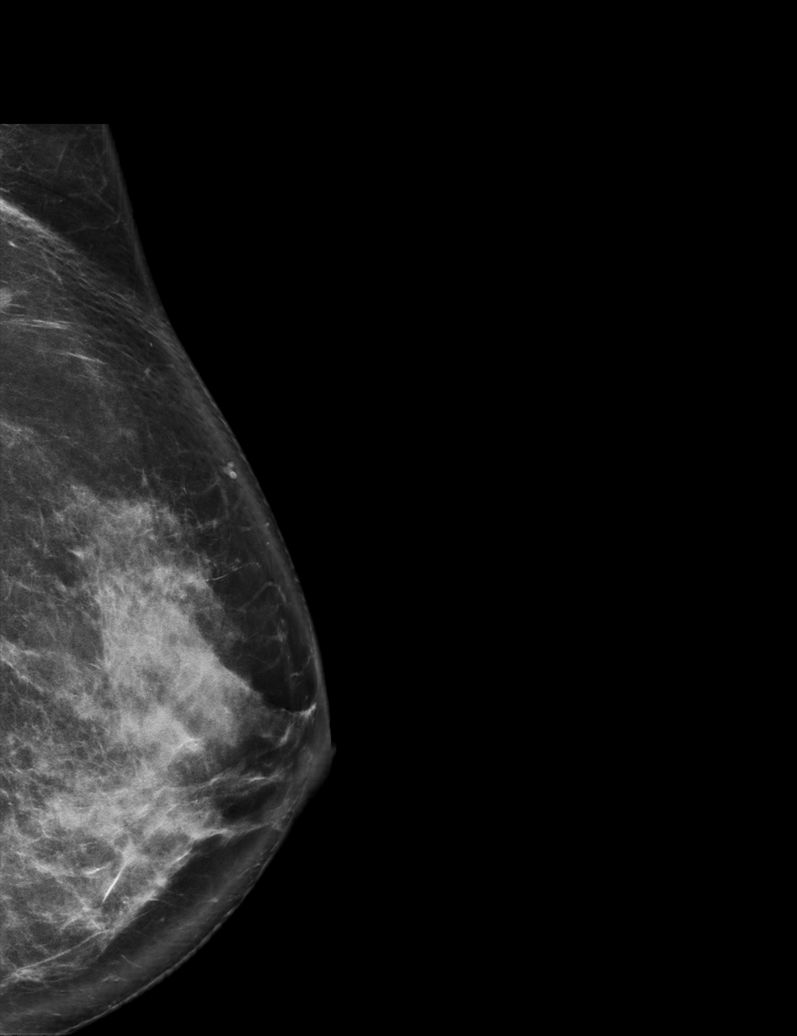

[L ML tomo · tomo slice 42/83.0]
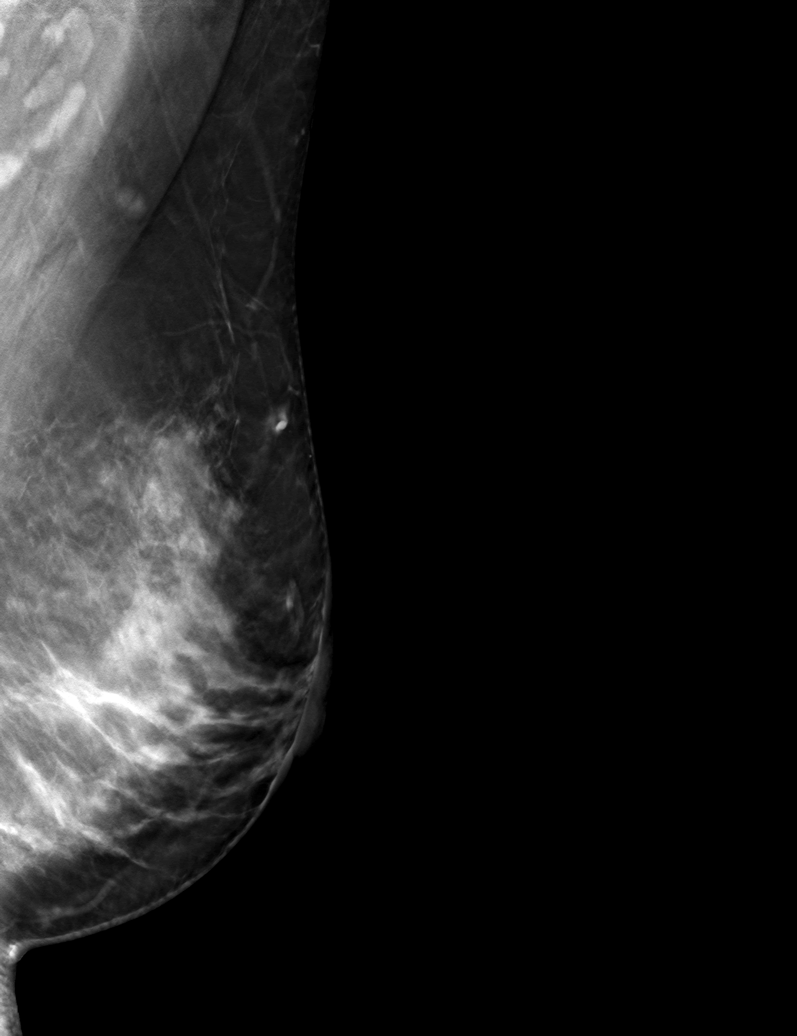

[6 of 30 positions shown; findings below may reference images not displayed]

ACR Breast Density Category c: The breast tissue is heterogeneously
dense, which may obscure small masses.
FINDINGS: LEFT breast diagnostic mammogram: On today's additional diagnostic
views, including spot compression with 3D tomosynthesis, there is a
possible subtle architectural distortion within the outer LEFT
breast, seen on CC views only, best demonstrated on screening CC
view slice 37, less convincing on today's repeat CC slice 52 and
spot compression CC slice 48.

Targeted ultrasound is performed, showing no definite correlate for
the possible subtle architectural distortion seen on mammogram.
There are scattered fibrocystic changes within the outer LEFT
breast, corresponding as an incidental finding.

LEFT axilla was evaluated ultrasound showing no enlarged or
morphologically abnormal lymph nodes.
IMPRESSION: 1. Possible subtle architectural distortion within the outer LEFT
breast, without definite sonographic correlate. Stereotactic biopsy
is recommended to exclude malignancy.
2. Scattered fibrocystic changes within the outer LEFT breast.

RECOMMENDATION:
1. Stereotactic biopsy, with 3D tomosynthesis guidance, for the
possible subtle architectural distortion in the outer LEFT breast.
2. Patient was informed that a suitable biopsy target may not
persist on the day of biopsy, and that if this occurred, a six-month
follow-up diagnostic mammogram would be recommended.

Stereotactic biopsy is scheduled for [REDACTED].

I have discussed the findings and recommendations with the patient.
If applicable, a reminder letter will be sent to the patient
regarding the next appointment.

BI-RADS CATEGORY  4: Suspicious.

## 2023-04-19 IMAGING — US US BREAST*L* LIMITED INC AXILLA
1 series · 13 of 13 positions shown · non-contrast
Comparison: Previous exams including recent screening mammogram
dated 03/13/2021.

CLINICAL DATA: Patient returns today to evaluate a possible LEFT
breast distortion questioned on recent screening mammogram.

EXAM:
DIGITAL DIAGNOSTIC UNILATERAL LEFT MAMMOGRAM WITH TOMOSYNTHESIS AND
CAD; ULTRASOUND LEFT BREAST LIMITED
TECHNIQUE: Left digital diagnostic mammography and breast tomosynthesis was
performed. The images were evaluated with computer-aided detection.;
Targeted ultrasound examination of the left breast was performed

[Series 1: us breast*left* limited inc axilla · 0.06mm/px · 13 of 13 slices shown]
[im 1/13]
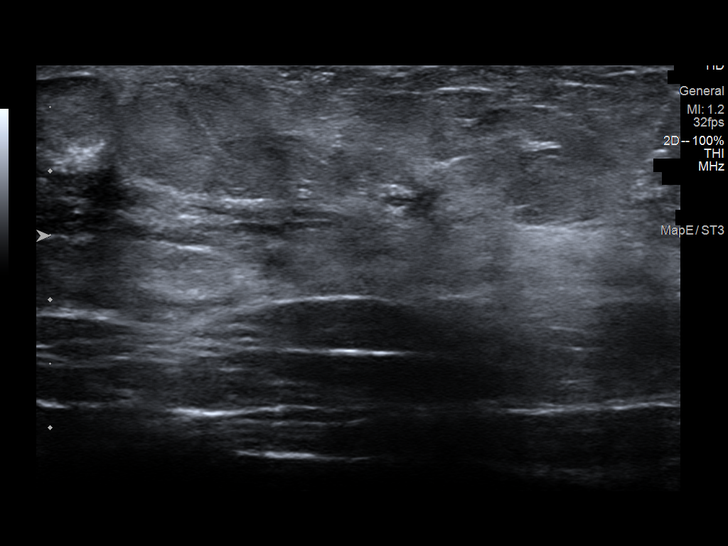
[im 2/13]
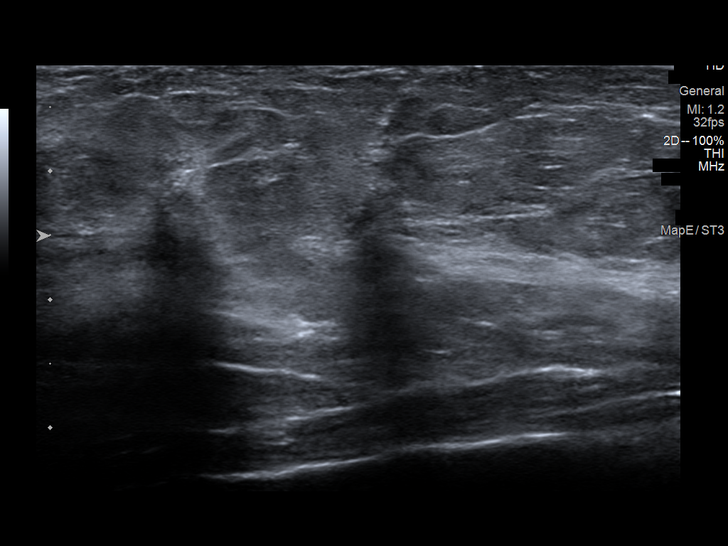
[im 3/13]
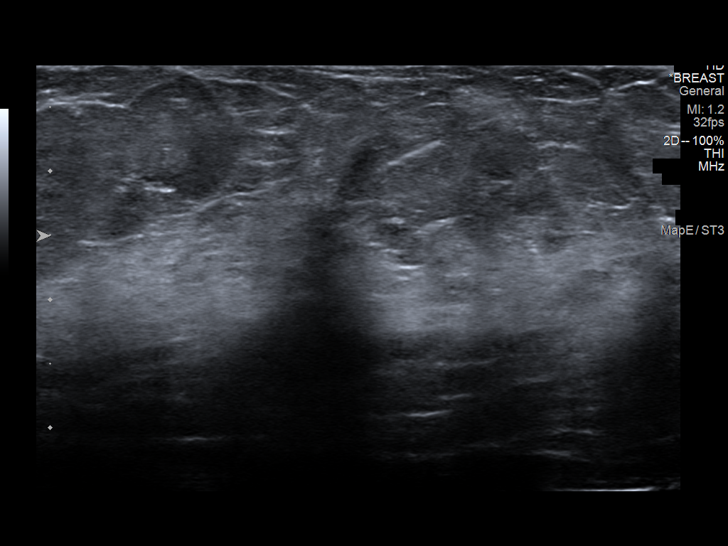
[im 4/13]
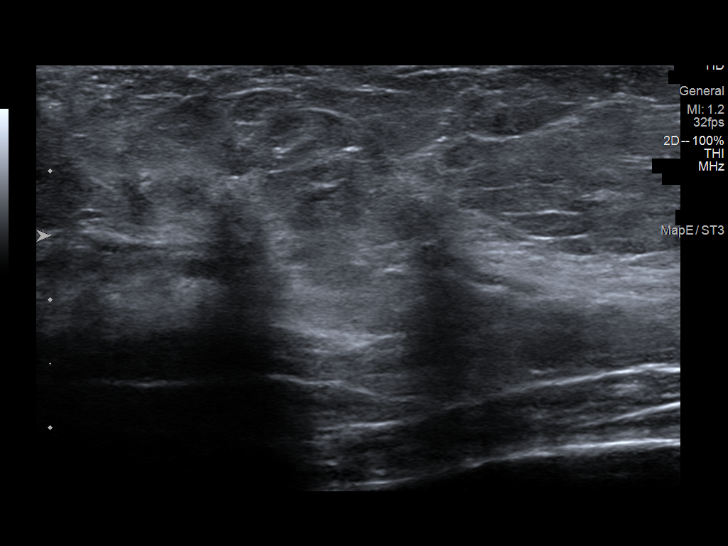
[im 5/13]
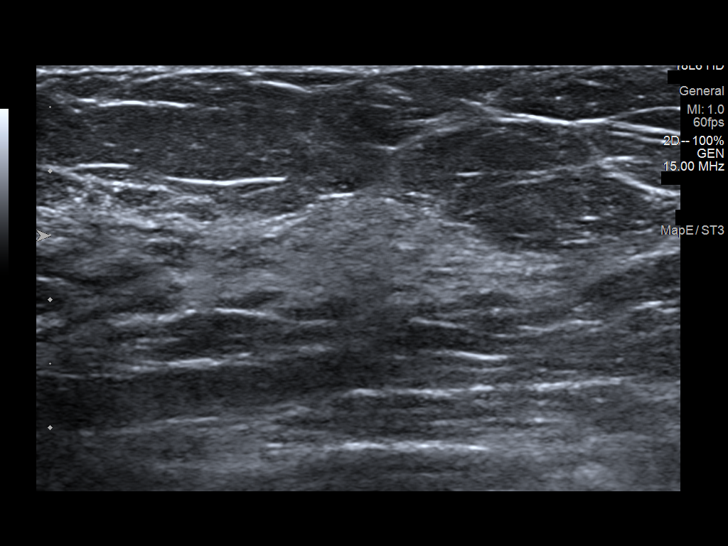
[im 6/13]
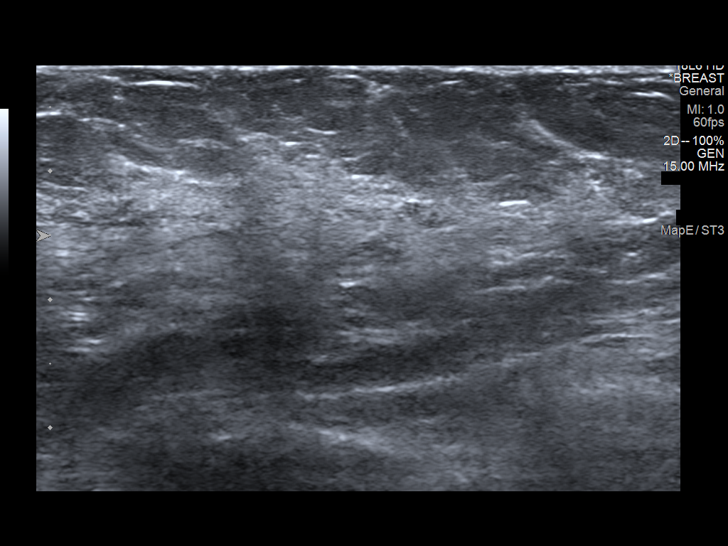
[im 7/13]
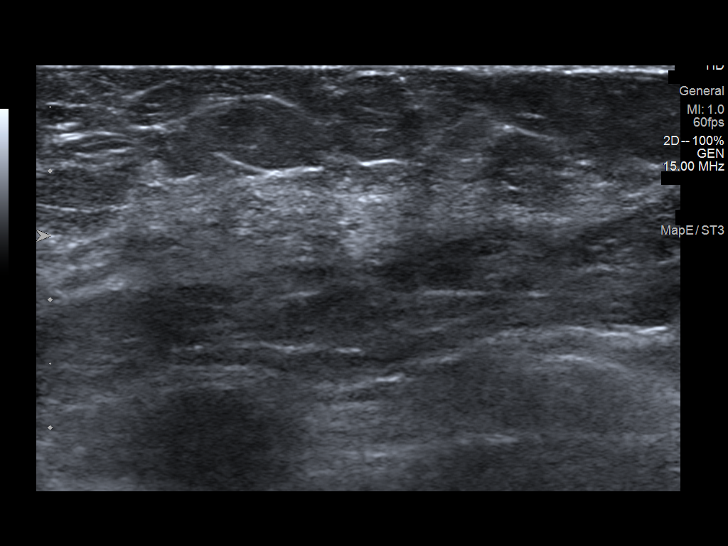
[im 8/13]
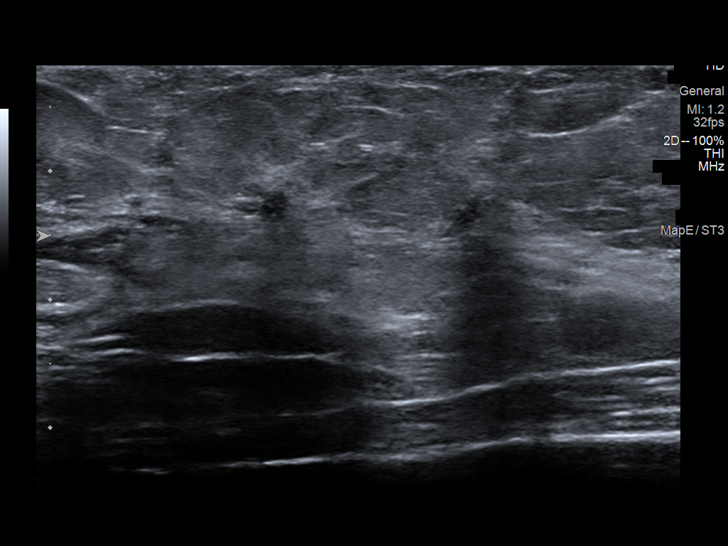
[im 9/13]
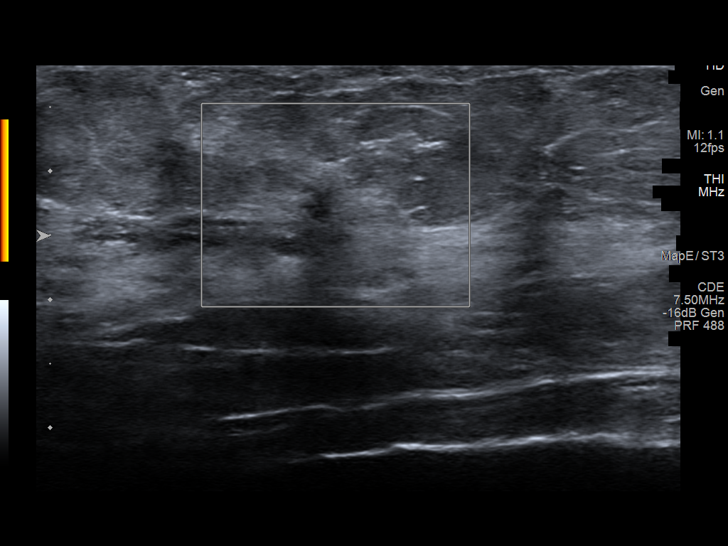
[im 10/13]
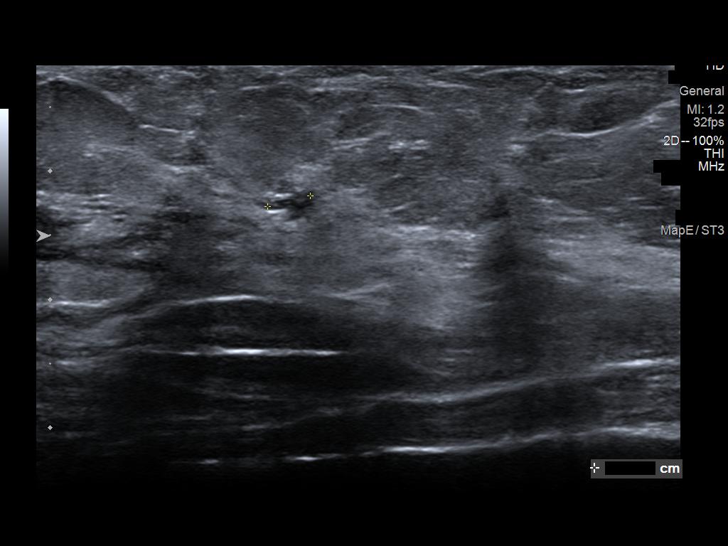
[im 11/13]
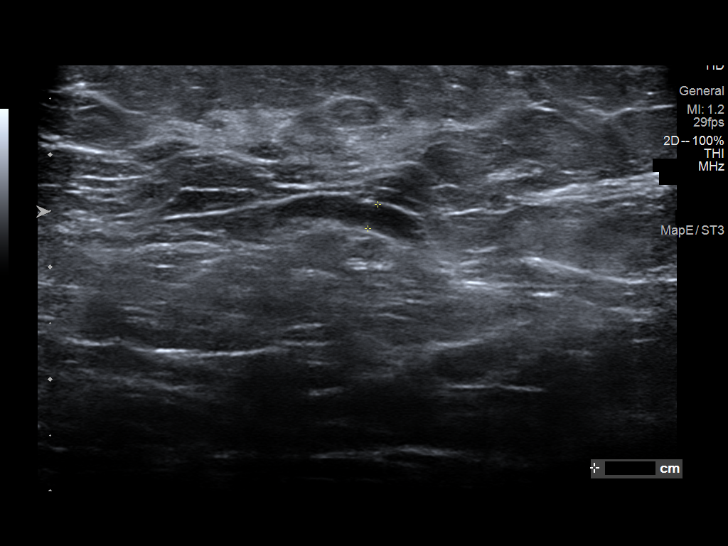
[im 12/13]
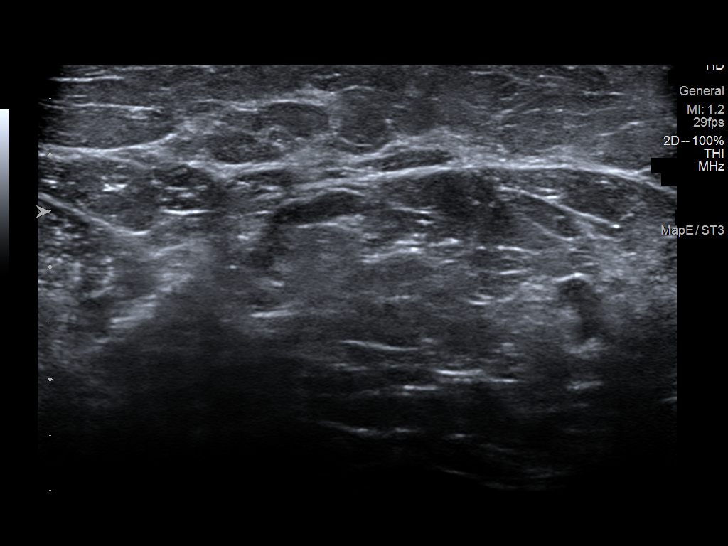
[im 13/13]
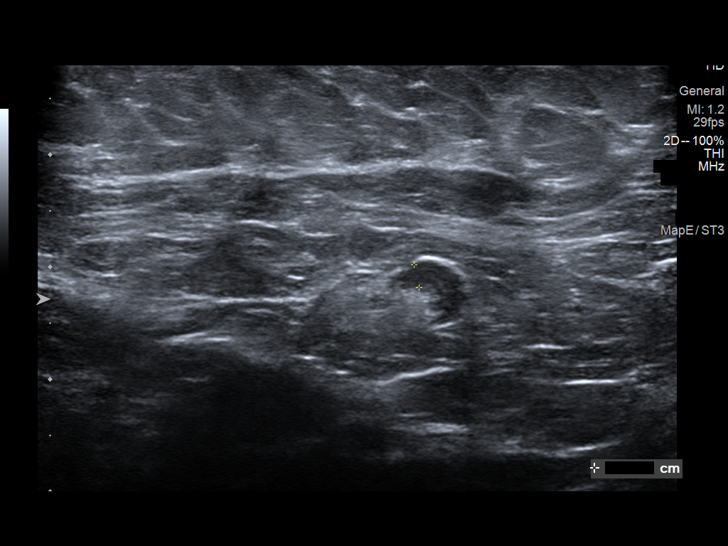

[13 of 13 positions shown; findings below may reference images not displayed]

ACR Breast Density Category c: The breast tissue is heterogeneously
dense, which may obscure small masses.
FINDINGS: LEFT breast diagnostic mammogram: On today's additional diagnostic
views, including spot compression with 3D tomosynthesis, there is a
possible subtle architectural distortion within the outer LEFT
breast, seen on CC views only, best demonstrated on screening CC
view slice 37, less convincing on today's repeat CC slice 52 and
spot compression CC slice 48.

Targeted ultrasound is performed, showing no definite correlate for
the possible subtle architectural distortion seen on mammogram.
There are scattered fibrocystic changes within the outer LEFT
breast, corresponding as an incidental finding.

LEFT axilla was evaluated ultrasound showing no enlarged or
morphologically abnormal lymph nodes.
IMPRESSION: 1. Possible subtle architectural distortion within the outer LEFT
breast, without definite sonographic correlate. Stereotactic biopsy
is recommended to exclude malignancy.
2. Scattered fibrocystic changes within the outer LEFT breast.

RECOMMENDATION:
1. Stereotactic biopsy, with 3D tomosynthesis guidance, for the
possible subtle architectural distortion in the outer LEFT breast.
2. Patient was informed that a suitable biopsy target may not
persist on the day of biopsy, and that if this occurred, a six-month
follow-up diagnostic mammogram would be recommended.

Stereotactic biopsy is scheduled for [REDACTED].

I have discussed the findings and recommendations with the patient.
If applicable, a reminder letter will be sent to the patient
regarding the next appointment.

BI-RADS CATEGORY  4: Suspicious.

## 2023-05-01 ENCOUNTER — Other Ambulatory Visit: Payer: Self-pay | Admitting: Family Medicine

## 2023-05-01 DIAGNOSIS — N6489 Other specified disorders of breast: Secondary | ICD-10-CM

## 2023-05-09 ENCOUNTER — Ambulatory Visit: Payer: BC Managed Care – PPO

## 2023-05-09 ENCOUNTER — Ambulatory Visit
Admission: RE | Admit: 2023-05-09 | Discharge: 2023-05-09 | Disposition: A | Discharge status: Home / Self Care | Payer: Medicaid Other | Source: Ambulatory Visit | Attending: Family Medicine | Admitting: Family Medicine

## 2023-05-09 DIAGNOSIS — N6489 Other specified disorders of breast: Secondary | ICD-10-CM

## 2024-06-14 NOTE — Progress Notes (Signed)
 [facility] - [facility] [place] [address] [place] [address]  Acute Visit Janice Richardson is a 58 y.o. female DOB: [birth year]   Subjective  Janice Richardson is a 58 y.o. female who presents for  Chief Complaint  Patient presents with  . Sore Throat    Pt complains of sore throat/ear pain/postnasal drainage x 1 day, pt stated she has been fine until yesterday afternoon when her throat hurt, pt declined flu/covid testing  .  Issues addressed by patient and physician today include:   HPI  Sore Throat  This is a new problem. The current episode started yesterday. The problem has been unchanged. There has been no fever. Associated symptoms include congestion, diarrhea, ear pain, swollen glands and trouble swallowing. Pertinent negatives include no coughing, ear discharge, headaches or vomiting. Associated symptoms comments: Post nasal drainage. She has had no exposure to strep or mono. She has tried nothing (nyquil) for the symptoms.   LEG PAIN Patient also reports that new onset leg pain with palpable lumps on her right inner thigh just above her knee. States she had full leg cramping starting in her back from her whole leg.   Review of Systems  HENT:  Positive for congestion, ear pain, sore throat and trouble swallowing. Negative for ear discharge.   Respiratory:  Negative for cough.   Gastrointestinal:  Positive for diarrhea. Negative for vomiting.  Neurological:  Negative for headaches.   __________________________________________________________________  Problem List[1]  Health Maintenance Status       Date Due Completion Dates   HIV Screening Never done ---   Hepatitis C Screening Never done ---   Comprehensive Annual Visit 12/17/2017 12/17/2016, 01/23/2012   Diabetes: Foot Exam 04/17/2023 04/16/2022 (Done)   Comment on 04/16/2022: See [facility]   Colorectal Cancer Screening 04/16/2024 04/17/2023   COVID-19 Vaccine (1 - 2024-25  season) Never done ---   Influenza Vaccine (1) 04/30/2024 ---   ZOSTER VACCINE (2 of 2) 06/09/2024 04/14/2024   Hepatitis B Vaccines (1 of 3 - 19+ 3-dose series) 07/15/2024 (Originally 08/16/1986) ---   Pneumococcal Vaccine for Ages 50+ (1 of 2 - PCV) 01/13/2025 (Originally 08/16/1986) ---   DTaP/Tdap/Td Vaccines (1 - Tdap) 01/13/2025 (Originally 08/16/1986) ---   Diabetes:  eGFR for Kidney Evaluation 01/13/2025 01/14/2024, 04/15/2023   Diabetes:  Quantitative uACR for Kidney Evaluation 01/13/2025 01/14/2024   Diabetes: Hemoglobin A1C 04/14/2025 04/14/2024, 04/14/2024   Depression Screening 04/14/2025 04/14/2024   Breast Cancer Screening (Mammogram) 05/08/2025 05/09/2023, 05/09/2023   Diabetes: Retinopathy Screening Combo 02/24/2026 02/25/2024, 02/25/2024   Comment on 11/18/2020: See [facility]   Cervical Cancer Screening 05/05/2028 05/06/2023, 05/06/2023   Adult RSV (60+ Years or Pregnancy) (1 - 1-dose 75+ series) 08/16/2042 ---        Immunization History  Administered Date(s) Administered  . Varicella Zoster [facility]) 18Y+ 04/14/2024    Surgical History[2]  Family History[3]  Tobacco Use: Low Risk  (06/15/2024)   Patient History   . Smoking Tobacco Use: Never   . Smokeless Tobacco Use: Never   . Passive Exposure: Not on file    Allergies[4]  Current Medications[5]  Objective   BP 132/82 (BP Location: Left arm, Patient Position: Sitting)   Pulse 68   Temp 97.8 F (36.6 C) (Oral)   Resp 16   Ht 1.651 m (5' 5)   Wt 64.8 kg (142 lb 14.4 oz)   SpO2 (!) 68%   BMI 23.78 kg/m  Wt Readings from  Last 3 Encounters:  06/14/24 64.8 kg (142 lb 14.4 oz)  04/14/24 68.9 kg (152 lb)  01/14/24 68.5 kg (151 lb)   Ht Readings from Last 3 Encounters:  06/14/24 1.651 m (5' 5)  04/14/24 1.651 m (5' 5)  01/14/24 1.651 m (5' 5)   Body mass index is 23.78 kg/m.   PHYSICAL EXAM: GENERAL APPEARANCE: Well appearing, well developed, well nourished, NAD MENTAL STATUS: Appears alert and  oriented. Affect appropriate. No manic s/s.  Mood normal.   HEENT: TMs normal, oropharynx clear.  Nasal mucosa WNL. NECK: Supple without lymphadenopathy or thyromegaly LUNGS: Clear to auscultation bilaterally HEART: Regular rate and rhythm, without significant murmurs. EXTREMITIES: palpable lump in right inner thigh without redness or swelling   Labs: Recent Results (from the past week)  POCT COVID BD   Collection Time: 06/14/24  4:51 PM  Result Value Ref Range   COVID Antigen, POC Negative Negative   Internal Control Acceptable    Kit/Device Lot # [id number]    Kit/Device Expiration Date 2122026   POC Rapid Influenza A & B Antigen   Collection Time: 06/14/24  4:52 PM  Result Value Ref Range   Flu A Negative Negative   Flu B Negative Negative   Internal Control Acceptable    Kit/Device Lot # [id number]    Kit/Device Expiration Date 2122026       Assessment/Plan   1. Exposure to COVID-19 virus  POC Rapid Influenza A & B Antigen   POCT COVID BD    2. Pain of right lower extremity  US  Peripheral Venous Leg Unilat Right   US  Peripheral Venous Leg Unilat Right    3. Palpable mass of soft tissue of lower extremity  US  Peripheral Venous Leg Unilat Right   US  Peripheral Venous Leg Unilat Right      No orders of the defined types were placed in this encounter.   Orders Placed This Encounter  Procedures  . US  Peripheral Venous Leg Unilat Right  . POC Rapid Influenza A & B Antigen  . POCT COVID BD     Patient Instructions  Covd/flu negative, otc medications for symptomatic treatment, discussed likely viral at this time and antibiotics are not indicated based on evaluation.  State dvt u.s ordered and faxed to [facility]     No follow-ups on file.          [1] Patient Active Problem List Diagnosis  . Hypothyroid  . Essential hypertension  . Type 2 diabetes mellitus with hyperglycemia, with long-term current use of insulin (HCC)  . Idiopathic peripheral neuropathy  .  Gastroesophageal reflux disease with esophagitis  . Metabolic syndrome  . Chronic anxiety  . Encounter for annual physical examination excluding gynecological examination in a patient older than 17 years  . Diabetic peripheral neuropathy (HCC)  . Insomnia  . Foot pain, bilateral  [2] Past Surgical History: Procedure Laterality Date  . CYST REMOVAL     Procedure: CYST REMOVAL  . PELVIC LAPAROSCOPY     Procedure: PELVIC LAPAROSCOPY  . TONSILLECTOMY     Procedure: TONSILLECTOMY  [3] Family History Problem Relation Name Age of Onset  . Diabetes Father    . Hyperlipidemia Father    . Heart disease Father    . Stroke Father    . Blood disease Father         Antiphospolipid  [4] Allergies Allergen Reactions  . Penicillins Swelling and GI Intolerance  [5]  Current Outpatient Medications:  .  ALPRAZolam (Xanax)  1 mg tablet, Take 1 tablet (1 mg total) by mouth nightly as needed for anxiety., Disp: 90 tablet, Rfl: 0 .  fluticasone propionate (FLONASE) 50 mcg/spray nasal spray, Administer 1 spray into each nostril 2 (two) times a day., Disp: 16 g, Rfl: 5 .  glucose blood (True Metrix Glucose Test Strip) test strip, USE AS DIRECTED, Disp: 200 each, Rfl: 3 .  insulin glargine (Lantus Solostar U-100 Insulin) 100 unit/mL (3 mL) pen, Inject 57 Units under the skin in the morning., Disp: 60 mL, Rfl: 1 .  levocetirizine (XYZAL) 5 mg tablet, Take 1 tablet (5 mg total) by mouth every evening., Disp: 90 tablet, Rfl: 3 .  lisinopriL (PRINIVIL) 20 mg tablet, Take 1 tablet (20 mg total) by mouth 2 (two) times a day., Disp: 180 tablet, Rfl: 3 .  norethindrone -e.estradioL-iron 1 mg-20 mcg (21)/75 mg (7) tab, TAKE 1 TABLET BY MOUTH DAILY. SKIP THE PLACEBO PILLS AND GO STRAIGHT INTO THE NEXT PACK OF PILLS., Disp: 56 tablet, Rfl: 3 .  pantoprazole (PROTONIX) 40 mg EC tablet, Take 1 tablet (40 mg total) by mouth daily., Disp: 120 tablet, Rfl: 3 .  pen needle, diabetic (BD Nano 2nd Gen Pen Needle) 32 gauge x  5/32 ndle, Twice daily as directed, Disp: 100 each, Rfl: 3 .  semaglutide  (RYBELSUS) 14 mg tab tablet, Take 1 tablet (14 mg total) by mouth daily., Disp: 90 tablet, Rfl: 1

## 2024-06-15 ENCOUNTER — Other Ambulatory Visit (HOSPITAL_BASED_OUTPATIENT_CLINIC_OR_DEPARTMENT_OTHER): Payer: Self-pay | Admitting: Nurse Practitioner

## 2024-06-15 DIAGNOSIS — M7989 Other specified soft tissue disorders: Secondary | ICD-10-CM

## 2024-06-16 ENCOUNTER — Ambulatory Visit (INDEPENDENT_AMBULATORY_CARE_PROVIDER_SITE_OTHER)
Admission: RE | Admit: 2024-06-16 | Discharge: 2024-06-16 | Disposition: A | Discharge status: Home / Self Care | Source: Ambulatory Visit | Attending: Nurse Practitioner | Admitting: Nurse Practitioner

## 2024-06-16 DIAGNOSIS — M7989 Other specified soft tissue disorders: Secondary | ICD-10-CM | POA: Diagnosis not present
